# Patient Record
Sex: Female | Born: 1990 | Hispanic: Yes | Marital: Single | Smoking: Never smoker
Health system: Southern US, Community
[De-identification: ages and names within clinical notes are randomized; demographics above are authoritative.]

## PROBLEM LIST (undated history)

## (undated) ENCOUNTER — Inpatient Hospital Stay (HOSPITAL_COMMUNITY): Payer: Self-pay

## (undated) DIAGNOSIS — A749 Chlamydial infection, unspecified: Secondary | ICD-10-CM

## (undated) DIAGNOSIS — Z789 Other specified health status: Secondary | ICD-10-CM

## (undated) HISTORY — DX: Chlamydial infection, unspecified: A74.9

## (undated) HISTORY — PX: NO PAST SURGERIES: SHX2092

## (undated) HISTORY — DX: Other specified health status: Z78.9

## (undated) HISTORY — PX: WISDOM TOOTH EXTRACTION: SHX21

---

## 2015-10-27 NOTE — L&D Delivery Note (Signed)
Delivery Note  After a 10 2nd stage, at 0039  a viable female was delivered via  (Presentation: ROA.  APGAR: 8/9; weight pending.  40 units of pitocin diluted in 1000cc LR was infused rapidly IV.  After 2 minutes, the cord was clamped and cut. 40 units of pitocin diluted in 1000cc LR was infused rapidly IV.  The placenta separated spontaneously and delivered via CCT and maternal pushing effort.  It was inspected and appears to be intact with a 3 VC.  There were the following complications:   Anesthesia: Epidural  Episiotomy: none Lacerations: 2nd degree Suture Repair: 2.0 vicryl Est. Blood Loss (mL): 400  Mom to postpartum.  Baby to Couplet care / Skin to Skin.

## 2015-12-02 ENCOUNTER — Other Ambulatory Visit (HOSPITAL_COMMUNITY): Payer: Self-pay | Admitting: Nurse Practitioner

## 2015-12-02 DIAGNOSIS — Z3491 Encounter for supervision of normal pregnancy, unspecified, first trimester: Secondary | ICD-10-CM

## 2015-12-02 LAB — OB RESULTS CONSOLE RUBELLA ANTIBODY, IGM: Rubella: NON-IMMUNE/NOT IMMUNE

## 2015-12-02 LAB — OB RESULTS CONSOLE ABO/RH: RH Type: POSITIVE

## 2015-12-02 LAB — OB RESULTS CONSOLE GC/CHLAMYDIA
CHLAMYDIA, DNA PROBE: POSITIVE
GC PROBE AMP, GENITAL: NEGATIVE

## 2015-12-02 LAB — OB RESULTS CONSOLE ANTIBODY SCREEN: Antibody Screen: NEGATIVE

## 2015-12-02 LAB — OB RESULTS CONSOLE HEPATITIS B SURFACE ANTIGEN: Hepatitis B Surface Ag: NEGATIVE

## 2015-12-02 LAB — OB RESULTS CONSOLE RPR: RPR: NONREACTIVE

## 2015-12-02 LAB — OB RESULTS CONSOLE HIV ANTIBODY (ROUTINE TESTING): HIV: NONREACTIVE

## 2015-12-04 ENCOUNTER — Ambulatory Visit (HOSPITAL_COMMUNITY): Admission: RE | Admit: 2015-12-04 | Payer: Medicaid Other | Source: Ambulatory Visit

## 2015-12-04 ENCOUNTER — Encounter (HOSPITAL_COMMUNITY): Payer: Self-pay

## 2015-12-04 ENCOUNTER — Other Ambulatory Visit (HOSPITAL_COMMUNITY): Payer: Self-pay | Admitting: Nurse Practitioner

## 2015-12-04 ENCOUNTER — Ambulatory Visit (HOSPITAL_COMMUNITY)
Admission: RE | Admit: 2015-12-04 | Discharge: 2015-12-04 | Disposition: A | Discharge status: Home / Self Care | Payer: Medicaid Other | Source: Ambulatory Visit | Attending: Nurse Practitioner | Admitting: Nurse Practitioner

## 2015-12-04 DIAGNOSIS — Z369 Encounter for antenatal screening, unspecified: Secondary | ICD-10-CM

## 2015-12-04 DIAGNOSIS — Z3A13 13 weeks gestation of pregnancy: Secondary | ICD-10-CM | POA: Insufficient documentation

## 2015-12-04 DIAGNOSIS — Z36 Encounter for antenatal screening of mother: Secondary | ICD-10-CM | POA: Insufficient documentation

## 2015-12-04 DIAGNOSIS — Z3491 Encounter for supervision of normal pregnancy, unspecified, first trimester: Secondary | ICD-10-CM

## 2016-01-27 ENCOUNTER — Other Ambulatory Visit: Payer: Self-pay | Admitting: Obstetrics & Gynecology

## 2016-01-27 DIAGNOSIS — IMO0002 Reserved for concepts with insufficient information to code with codable children: Secondary | ICD-10-CM

## 2016-01-27 DIAGNOSIS — Z3689 Encounter for other specified antenatal screening: Secondary | ICD-10-CM

## 2016-01-30 ENCOUNTER — Ambulatory Visit (HOSPITAL_COMMUNITY)
Admission: RE | Admit: 2016-01-30 | Discharge: 2016-01-30 | Disposition: A | Discharge status: Home / Self Care | Payer: Medicaid Other | Source: Ambulatory Visit | Attending: Nurse Practitioner | Admitting: Nurse Practitioner

## 2016-01-30 ENCOUNTER — Other Ambulatory Visit: Payer: Self-pay | Admitting: Obstetrics & Gynecology

## 2016-01-30 DIAGNOSIS — Z3A22 22 weeks gestation of pregnancy: Secondary | ICD-10-CM | POA: Diagnosis not present

## 2016-01-30 DIAGNOSIS — Z3689 Encounter for other specified antenatal screening: Secondary | ICD-10-CM

## 2016-01-30 DIAGNOSIS — Z36 Encounter for antenatal screening of mother: Secondary | ICD-10-CM | POA: Diagnosis present

## 2016-05-07 LAB — OB RESULTS CONSOLE GC/CHLAMYDIA
CHLAMYDIA, DNA PROBE: NEGATIVE
GC PROBE AMP, GENITAL: NEGATIVE

## 2016-05-07 LAB — OB RESULTS CONSOLE GBS: STREP GROUP B AG: NEGATIVE

## 2016-06-08 ENCOUNTER — Telehealth (HOSPITAL_COMMUNITY): Payer: Self-pay | Admitting: *Deleted

## 2016-06-09 ENCOUNTER — Encounter (HOSPITAL_COMMUNITY): Payer: Self-pay | Admitting: *Deleted

## 2016-06-09 NOTE — Telephone Encounter (Signed)
Preadmission screen  

## 2016-06-10 ENCOUNTER — Encounter (HOSPITAL_COMMUNITY): Payer: Self-pay | Admitting: *Deleted

## 2016-06-10 ENCOUNTER — Inpatient Hospital Stay (HOSPITAL_COMMUNITY)
Admission: AD | Admit: 2016-06-10 | Discharge: 2016-06-10 | Disposition: A | Discharge status: Home / Self Care | Payer: Medicaid Other | Source: Ambulatory Visit | Attending: Family Medicine | Admitting: Family Medicine

## 2016-06-10 NOTE — MAU Note (Signed)
Pt presents complaining of vaginal bleeding. States it was spotting earlier but now it is heavier like a period. Denies leaking of fluid. Some cramping. Reports good fetal movement.

## 2016-06-10 NOTE — Discharge Instructions (Signed)
Keep scheduled induction apt tomorrow.   Braxton Hicks Contractions Contractions of the uterus can occur throughout pregnancy. Contractions are not always a sign that you are in labor.  WHAT ARE BRAXTON HICKS CONTRACTIONS?  Contractions that occur before labor are called Braxton Hicks contractions, or false labor. Toward the end of pregnancy (32-34 weeks), these contractions can develop more often and may become more forceful. This is not true labor because these contractions do not result in opening (dilatation) and thinning of the cervix. They are sometimes difficult to tell apart from true labor because these contractions can be forceful and people have different pain tolerances. You should not feel embarrassed if you go to the hospital with false labor. Sometimes, the only way to tell if you are in true labor is for your health care provider to look for changes in the cervix. If there are no prenatal problems or other health problems associated with the pregnancy, it is completely safe to be sent home with false labor and await the onset of true labor. HOW CAN YOU TELL THE DIFFERENCE BETWEEN TRUE AND FALSE LABOR? False Labor  The contractions of false labor are usually shorter and not as hard as those of true labor.   The contractions are usually irregular.   The contractions are often felt in the front of the lower abdomen and in the groin.   The contractions may go away when you walk around or change positions while lying down.   The contractions get weaker and are shorter lasting as time goes on.   The contractions do not usually become progressively stronger, regular, and closer together as with true labor.  True Labor  Contractions in true labor last 30-70 seconds, become very regular, usually become more intense, and increase in frequency.   The contractions do not go away with walking.   The discomfort is usually felt in the top of the uterus and spreads to the lower  abdomen and low back.   True labor can be determined by your health care provider with an exam. This will show that the cervix is dilating and getting thinner.  WHAT TO REMEMBER  Keep up with your usual exercises and follow other instructions given by your health care provider.   Take medicines as directed by your health care provider.   Keep your regular prenatal appointments.   Eat and drink lightly if you think you are going into labor.   If Braxton Hicks contractions are making you uncomfortable:   Change your position from lying down or resting to walking, or from walking to resting.   Sit and rest in a tub of warm water.   Drink 2-3 glasses of water. Dehydration may cause these contractions.   Do slow and deep breathing several times an hour.  WHEN SHOULD I SEEK IMMEDIATE MEDICAL CARE? Seek immediate medical care if:  Your contractions become stronger, more regular, and closer together.   You have fluid leaking or gushing from your vagina.   You have a fever.   You have vaginal bleeding.   You have continuous abdominal pain.   You have low back pain that you never had before.   You feel your baby's head pushing down and causing pelvic pressure.   Your baby is not moving as much as it used to.    This information is not intended to replace advice given to you by your health care provider. Make sure you discuss any questions you have with your health  care provider.   Document Released: 10/12/2005 Document Revised: 10/17/2013 Document Reviewed: 07/24/2013 Elsevier Interactive Patient Education [employer]2016 [employer] Inc.

## 2016-06-11 ENCOUNTER — Inpatient Hospital Stay (HOSPITAL_COMMUNITY): Payer: Medicaid Other | Admitting: Anesthesiology

## 2016-06-11 ENCOUNTER — Other Ambulatory Visit: Payer: Self-pay | Admitting: Advanced Practice Midwife

## 2016-06-11 ENCOUNTER — Inpatient Hospital Stay (HOSPITAL_COMMUNITY)
Admission: RE | Admit: 2016-06-11 | Discharge: 2016-06-14 | DRG: 775 | Disposition: A | Discharge status: Home / Self Care | Payer: Medicaid Other | Source: Ambulatory Visit | Attending: Obstetrics & Gynecology | Admitting: Obstetrics & Gynecology

## 2016-06-11 ENCOUNTER — Encounter (HOSPITAL_COMMUNITY): Payer: Self-pay

## 2016-06-11 DIAGNOSIS — Z6841 Body Mass Index (BMI) 40.0 and over, adult: Secondary | ICD-10-CM

## 2016-06-11 DIAGNOSIS — O99214 Obesity complicating childbirth: Secondary | ICD-10-CM | POA: Diagnosis present

## 2016-06-11 DIAGNOSIS — Z3A41 41 weeks gestation of pregnancy: Secondary | ICD-10-CM | POA: Diagnosis not present

## 2016-06-11 DIAGNOSIS — O480 Post-term pregnancy: Secondary | ICD-10-CM | POA: Diagnosis present

## 2016-06-11 LAB — CBC
HCT: 39.2 % (ref 36.0–46.0)
Hemoglobin: 14.2 g/dL (ref 12.0–15.0)
MCH: 32.6 pg (ref 26.0–34.0)
MCHC: 36.2 g/dL — ABNORMAL HIGH (ref 30.0–36.0)
MCV: 90.1 fL (ref 78.0–100.0)
PLATELETS: 209 10*3/uL (ref 150–400)
RBC: 4.35 MIL/uL (ref 3.87–5.11)
RDW: 12.8 % (ref 11.5–15.5)
WBC: 13.2 10*3/uL — AB (ref 4.0–10.5)

## 2016-06-11 LAB — TYPE AND SCREEN
ABO/RH(D): O POS
ANTIBODY SCREEN: NEGATIVE

## 2016-06-11 LAB — RPR: RPR: NONREACTIVE

## 2016-06-11 LAB — ABO/RH: ABO/RH(D): O POS

## 2016-06-11 MED ORDER — SOD CITRATE-CITRIC ACID 500-334 MG/5ML PO SOLN
30.0000 mL | ORAL | Status: DC | PRN
Start: 2016-06-11 — End: 2016-06-12

## 2016-06-11 MED ORDER — OXYTOCIN BOLUS FROM INFUSION
500.0000 mL | Freq: Once | INTRAVENOUS | Status: AC
  Administered 2016-06-12: 500 mL via INTRAVENOUS

## 2016-06-11 MED ORDER — OXYTOCIN 40 UNITS IN LACTATED RINGERS INFUSION - SIMPLE MED
1.0000 m[IU]/min | INTRAVENOUS | Status: DC
  Administered 2016-06-11: 2 m[IU]/min via INTRAVENOUS
  Filled 2016-06-11: qty 1000

## 2016-06-11 MED ORDER — ACETAMINOPHEN 325 MG PO TABS: 650.0000 mg | ORAL_TABLET | ORAL | Status: DC | PRN

## 2016-06-11 MED ORDER — OXYCODONE-ACETAMINOPHEN 5-325 MG PO TABS: 1.0000 | ORAL_TABLET | ORAL | Status: DC | PRN

## 2016-06-11 MED ORDER — PHENYLEPHRINE 40 MCG/ML (10ML) SYRINGE FOR IV PUSH (FOR BLOOD PRESSURE SUPPORT): 80.0000 ug | PREFILLED_SYRINGE | INTRAVENOUS | Status: DC | PRN

## 2016-06-11 MED ORDER — EPHEDRINE 5 MG/ML INJ
10.0000 mg | INTRAVENOUS | Status: DC | PRN
  Filled 2016-06-11: qty 4

## 2016-06-11 MED ORDER — FENTANYL 2.5 MCG/ML BUPIVACAINE 1/10 % EPIDURAL INFUSION (WH - ANES)
14.0000 mL/h | INTRAMUSCULAR | Status: DC | PRN
  Administered 2016-06-11 (×3): 14 mL/h via EPIDURAL
  Filled 2016-06-11 (×2): qty 125

## 2016-06-11 MED ORDER — ONDANSETRON HCL 4 MG/2ML IJ SOLN: 4.0000 mg | Freq: Four times a day (QID) | INTRAMUSCULAR | Status: DC | PRN

## 2016-06-11 MED ORDER — LACTATED RINGERS IV SOLN: 500.0000 mL | Freq: Once | INTRAVENOUS | Status: DC

## 2016-06-11 MED ORDER — LACTATED RINGERS IV SOLN
INTRAVENOUS | Status: DC
  Administered 2016-06-11 (×2): via INTRAVENOUS

## 2016-06-11 MED ORDER — EPHEDRINE 5 MG/ML INJ: 10.0000 mg | INTRAVENOUS | Status: DC | PRN

## 2016-06-11 MED ORDER — DIPHENHYDRAMINE HCL 50 MG/ML IJ SOLN: 12.5000 mg | INTRAMUSCULAR | Status: DC | PRN

## 2016-06-11 MED ORDER — OXYTOCIN 40 UNITS IN LACTATED RINGERS INFUSION - SIMPLE MED: 2.5000 [IU]/h | INTRAVENOUS | Status: DC

## 2016-06-11 MED ORDER — LACTATED RINGERS IV SOLN: 500.0000 mL | INTRAVENOUS | Status: DC | PRN

## 2016-06-11 MED ORDER — TERBUTALINE SULFATE 1 MG/ML IJ SOLN
0.2500 mg | Freq: Once | INTRAMUSCULAR | Status: DC | PRN
  Filled 2016-06-11: qty 1

## 2016-06-11 MED ORDER — FENTANYL CITRATE (PF) 100 MCG/2ML IJ SOLN
100.0000 ug | INTRAMUSCULAR | Status: DC | PRN
  Administered 2016-06-11 (×2): 100 ug via INTRAVENOUS
  Filled 2016-06-11 (×2): qty 2

## 2016-06-11 MED ORDER — PHENYLEPHRINE 40 MCG/ML (10ML) SYRINGE FOR IV PUSH (FOR BLOOD PRESSURE SUPPORT)
80.0000 ug | PREFILLED_SYRINGE | INTRAVENOUS | Status: DC | PRN
  Filled 2016-06-11: qty 10
  Filled 2016-06-11: qty 5

## 2016-06-11 MED ORDER — LIDOCAINE HCL (PF) 1 % IJ SOLN
30.0000 mL | INTRAMUSCULAR | Status: DC | PRN
  Filled 2016-06-11: qty 30

## 2016-06-11 MED ORDER — OXYCODONE-ACETAMINOPHEN 5-325 MG PO TABS: 2.0000 | ORAL_TABLET | ORAL | Status: DC | PRN

## 2016-06-11 MED ORDER — LACTATED RINGERS IV SOLN
500.0000 mL | Freq: Once | INTRAVENOUS | Status: AC
  Administered 2016-06-11: 1000 mL via INTRAVENOUS

## 2016-06-11 MED ORDER — PHENYLEPHRINE 40 MCG/ML (10ML) SYRINGE FOR IV PUSH (FOR BLOOD PRESSURE SUPPORT)
80.0000 ug | PREFILLED_SYRINGE | INTRAVENOUS | Status: DC | PRN
  Filled 2016-06-11: qty 5

## 2016-06-11 MED ORDER — MISOPROSTOL 25 MCG QUARTER TABLET
25.0000 ug | ORAL_TABLET | ORAL | Status: DC | PRN
  Filled 2016-06-11: qty 1
  Filled 2016-06-11: qty 0.25

## 2016-06-11 MED ORDER — LIDOCAINE HCL (PF) 1 % IJ SOLN
INTRAMUSCULAR | Status: DC | PRN
  Administered 2016-06-11: 2 mL
  Administered 2016-06-11: 3 mL
  Administered 2016-06-11: 5 mL

## 2016-06-11 NOTE — Progress Notes (Signed)
Pt noted to be C/C/0 station.  No pressure. FHR cat 1.  Will labor down

## 2016-06-11 NOTE — Progress Notes (Signed)
LABOR PROGRESS NOTE  Natasha ChurchMarlen Guevara Conway is a 25 y.o. G1P0000 with IUP at 1130w0d by LMP presenting for IOL for postdates.  Found to be in active labor on admission  Subjective: Doing well, no complaints  Objective: BP 136/67   Pulse 97   Temp 98.9 F (37.2 C) (Oral)   Resp 20   Ht 5\' 2"  (1.575 m)   Wt 103 kg (227 lb)   LMP 08/29/2015   BMI 41.52 kg/m  or  Vitals:   06/11/16 0732 06/11/16 0734 06/11/16 1049 06/11/16 1354  BP:  120/74 124/74 136/67  Pulse:  96 91 97  Resp: 18 18 20 20   Temp:  99 F (37.2 C) 97.7 F (36.5 C) 98.9 F (37.2 C)  TempSrc: Oral Oral Oral Oral  Weight: 103 kg (227 lb)     Height: 5\' 2"  (1.575 m)       FHT:  baseline 150, mod variability, + accelerations, no decelerations Uterine activity: toco having difficulty picking up activity but approx q505min Dilation: 4.5 Effacement (%): 100 Cervical Position: Anterior Station: -2 Presentation: Vertex Exam by:: Enis SlipperJane Bailey, RN  7.8/100/-1 by myself and RN Enis SlipperJane Bailey   Labs: Lab Results  Component Value Date   WBC 13.2 (H) 06/11/2016   HGB 14.2 06/11/2016   HCT 39.2 06/11/2016   MCV 90.1 06/11/2016   PLT 209 06/11/2016    Patient Active Problem List   Diagnosis Date Noted  . Post term pregnancy, 41 weeks 06/11/2016    Assessment / Plan: 25 y.o. G1P0000 at 1430w0d here for IOL for postdates, found to be in active labor on admission  Labor: consider AROM vs start pitocin Fetal Wellbeing:  Category I Pain Control:  fentanyl Anticipated MOD:  SVD  Leland HerElsia J Yoo, DO  PGY-1 8/17/20172:06 PM

## 2016-06-11 NOTE — Progress Notes (Signed)
LABOR PROGRESS NOTE  [name] is a 25 y.o. G1P0000 with IUP at 6527w0d by LMPpresenting for IOL for postdates.  Found to be in active labor on admission  Subjective: Feels better on fentanyl but still uncomfortable during AROM and placement of IUPC  Objective: BP 107/82   Pulse 92   Temp 98.9 F (37.2 C) (Oral)   Resp 20   Ht 5\' 2"  (1.575 m)   Wt 103 kg (227 lb)   LMP 08/29/2015   BMI 41.52 kg/m  or  Vitals:   06/11/16 0734 06/11/16 1049 06/11/16 1354 06/11/16 1400  BP: 120/74 124/74 136/67 107/82  Pulse: 96 91 97 92  Resp: 18 20 20 20   Temp: 99 F (37.2 C) 97.7 F (36.5 C) 98.9 F (37.2 C)   TempSrc: Oral Oral Oral   Weight:      Height:        FHT: baseline 150, mod variability, + accelerations, no decelerations Uterine activity toco having difficulty picking up activity but approx q85min Dilation: 7.5 Effacement (%): 100 Cervical Position: Anterior Station: -2, -1 Presentation: Vertex Exam by:: Dr. Artist PaisYoo & Enis SlipperJane Bailey, RN  Labs: Lab Results  Component Value Date   WBC 13.2 (H) 06/11/2016   HGB 14.2 06/11/2016   HCT 39.2 06/11/2016   MCV 90.1 06/11/2016   PLT 209 06/11/2016    Patient Active Problem List   Diagnosis Date Noted  . Post term pregnancy, 41 weeks 06/11/2016    Assessment / Plan: 25 y.o. G1P0000 at 4527w0d here for IOL for postdates. Found to be in active labor on admission. Now AROM to clear and IUPC in place  Labor: continue pitocin Fetal Wellbeing:  Category I Pain Control:  Fentanyl, patient considering epidural Anticipated MOD:  SVD  Leland HerElsia J Yoo, DO  PGY-1, [facility] 8/17/20172:46 PM

## 2016-06-11 NOTE — Anesthesia Pain Management Evaluation Note (Signed)
  CRNA Pain Management Visit Note  Patient: Natasha Conway, 25 y.o., female  "Hello I am a member of the anesthesia team at [facility]. We have an anesthesia team available at all times to provide care throughout the hospital, including epidural management and anesthesia for C-section. I don't know your plan for the delivery whether it a natural birth, water birth, IV sedation, nitrous supplementation, doula or epidural, but we want to meet your pain goals."   1.Was your pain managed to your expectations on prior hospitalizations?   No prior hospitalizations  2.What is your expectation for pain management during this hospitalization?     Labor support without medications  3.How can we help you reach that goal? Patient wants to try natural childbirth but may consider other options depending on labor progression.  Patient was informed of the options of IV pain medication, nitrous, and epidural, but was told that this is her plan and was not coerced into receiving any interventions.  All questions answered.  Record the patient's initial score and the patient's pain goal.   Pain: 4  Pain Goal: 6 [facility] wants you to be able to say your pain was always managed very well.  Natasha Conway 06/11/2016

## 2016-06-11 NOTE — Anesthesia Procedure Notes (Signed)
Epidural Patient location during procedure: OB  Staffing Anesthesiologist: Marcene DuosFITZGERALD, Mahalia Dykes Performed: anesthesiologist   Preanesthetic Checklist Completed: patient identified, site marked, surgical consent, pre-op evaluation, timeout performed, IV checked, risks and benefits discussed and monitors and equipment checked  Epidural Patient position: sitting Prep: site prepped and draped and DuraPrep Patient monitoring: continuous pulse ox and blood pressure Approach: midline Location: L4-L5 Injection technique: LOR saline  Needle:  Needle type: Tuohy  Needle gauge: 17 G Needle length: 9 cm and 9 Needle insertion depth: 8 cm Catheter type: closed end flexible Catheter size: 19 Gauge Catheter at skin depth: 14 (13cm initially at the skin. Advanced to 14 when laid in lat decub position) cm Test dose: negative  Assessment Events: blood not aspirated, injection not painful, no injection resistance, negative IV test and no paresthesia

## 2016-06-11 NOTE — Anesthesia Preprocedure Evaluation (Signed)
Anesthesia Evaluation  Patient identified by MRN, date of birth, ID band Patient awake    Reviewed: Allergy & Precautions, Patient's Chart, lab work & pertinent test results  Airway Mallampati: III       Dental   Pulmonary neg pulmonary ROS,    Pulmonary exam normal        Cardiovascular negative cardio ROS Normal cardiovascular exam     Neuro/Psych negative neurological ROS     GI/Hepatic negative GI ROS, Neg liver ROS,   Endo/Other  Morbid obesity  Renal/GU negative Renal ROS     Musculoskeletal   Abdominal   Peds  Hematology negative hematology ROS (+)   Anesthesia Other Findings   Reproductive/Obstetrics (+) Pregnancy                             Lab Results  Component Value Date   WBC 13.2 (H) 06/11/2016   HGB 14.2 06/11/2016   HCT 39.2 06/11/2016   MCV 90.1 06/11/2016   PLT 209 06/11/2016    Anesthesia Physical Anesthesia Plan  ASA: III  Anesthesia Plan: Epidural   Post-op Pain Management:    Induction:   Airway Management Planned: Natural Airway  Additional Equipment:   Intra-op Plan:   Post-operative Plan:   Informed Consent: I have reviewed the patients History and Physical, chart, labs and discussed the procedure including the risks, benefits and alternatives for the proposed anesthesia with the patient or authorized representative who has indicated his/her understanding and acceptance.     Plan Discussed with:   Anesthesia Plan Comments:         Anesthesia Quick Evaluation

## 2016-06-11 NOTE — H&P (Signed)
LABOR AND DELIVERY ADMISSION HISTORY AND PHYSICAL NOTE  Natasha Conway is a 25 y.o. female G1P0000 with IUP at 3919w0d by LMP presenting for IOL for postdates.  Found to be in active labor on admission She reports positive fetal movement. She denies leakage of fluid or vaginal bleeding.  Prenatal History/Complications:  Past Medical History: Past Medical History:  Diagnosis Date  . Medical history non-contributory     Past Surgical History: Past Surgical History:  Procedure Laterality Date  . NO PAST SURGERIES    . WISDOM TOOTH EXTRACTION      Obstetrical History: OB History    Gravida Para Term Preterm AB Living   1 0 0 0 0 0   SAB TAB Ectopic Multiple Live Births   0 0 0 0 0      Social History: Social History   Social History  . Marital status: Single    Spouse name: N/A  . Number of children: N/A  . Years of education: N/A   Social History Main Topics  . Smoking status: Never Smoker  . Smokeless tobacco: Never Used  . Alcohol use No  . Drug use: No  . Sexual activity: Not Currently    Birth control/ protection: None   Other Topics Concern  . None   Social History Narrative  . None    Family History: History reviewed. No pertinent family history.  Allergies: No Known Allergies  Prescriptions Prior to Admission  Medication Sig Dispense Refill Last Dose  . Prenatal Vit-Fe Fumarate-FA (PRENATAL MULTIVITAMIN) TABS tablet Take 1 tablet by mouth at bedtime.   06/10/2016 at Unknown time     Review of Systems   All systems reviewed and negative except as stated in HPI  Blood pressure 120/74, pulse 96, temperature 99 F (37.2 C), temperature source Oral, resp. rate 18, height 5\' 2"  (1.575 m), weight 103 kg (227 lb), last menstrual period 08/29/2015. General appearance: alert, cooperative and no distress Lungs: no respiratory distress Heart: regular rate Abdomen: soft, non-tender Extremities: No calf swelling or tenderness Presentation:  cephalic by cervical exam Fetal monitoring: baseline 150, mod variability, + accelerations, no decelerations Uterine activity: toco unable to pick up ctx Dilation: 4.5 Effacement (%): 100 Station: -2 Exam by:: Enis SlipperJane Bailey, RN   Prenatal labs: ABO, Rh: --/--/O POS (08/17 0730) Antibody: NEG (08/17 0730) Rubella: nonimmune RPR: Nonreactive (02/06 0000)  HBsAg: Negative (02/06 0000)  HIV: Non-reactive (02/06 0000)  GBS: Negative (07/13 0000)  Genetic screening:  declined Anatomy US: normal  Prenatal Transfer Tool  Maternal Diabetes: No Genetic Screening: Declined Maternal Ultrasounds/Referrals: Normal Fetal Ultrasounds or other Referrals:  None Maternal Substance Abuse:  No Significant Maternal Medications:  None Significant Maternal Lab Results: Lab values include: Group B Strep negative  Results for orders placed or performed during the hospital encounter of 06/11/16 (from the past 24 hour(s))  CBC   Collection Time: 06/11/16  7:30 AM  Result Value Ref Range   WBC 13.2 (H) 4.0 - 10.5 K/uL   RBC 4.35 3.87 - 5.11 MIL/uL   Hemoglobin 14.2 12.0 - 15.0 g/dL   HCT 16.139.2 09.636.0 - 04.546.0 %   MCV 90.1 78.0 - 100.0 fL   MCH 32.6 26.0 - 34.0 pg   MCHC 36.2 (H) 30.0 - 36.0 g/dL   RDW 40.912.8 81.111.5 - 91.415.5 %   Platelets 209 150 - 400 K/uL  Type and screen [facility]   Collection Time: 06/11/16  7:30 AM  Result Value Ref  Range   ABO/RH(D) O POS    Antibody Screen NEG    Sample Expiration 06/14/2016     Patient Active Problem List   Diagnosis Date Noted  . Post term pregnancy, 41 weeks 06/11/2016    Assessment: Natasha Conway is a 25 y.o. G1P0000 with IUP at 1321w0d by LMP presenting for IOL for postdates. Found to be in active labor on admission  #Labor: consider AROM, expectant management #Pain: Undecided on epidural #FWB: Category I #ID:  GBS neg  #MOF: breast first and bottle #MOC: undecided #Circ:  declined  Leland HerElsia J Yoo, DO PGY-1 8/17/201710:25  AM   OB FELLOW HISTORY AND PHYSICAL ATTESTATION  I have seen and examined this patient; I agree with above documentation in the resident's note.    Jen MowElizabeth Mumaw, DO OB Fellow 06/11/2016, 10:36 AM

## 2016-06-12 ENCOUNTER — Encounter (HOSPITAL_COMMUNITY): Payer: Self-pay

## 2016-06-12 DIAGNOSIS — O480 Post-term pregnancy: Secondary | ICD-10-CM

## 2016-06-12 DIAGNOSIS — O99214 Obesity complicating childbirth: Secondary | ICD-10-CM

## 2016-06-12 DIAGNOSIS — Z3A41 41 weeks gestation of pregnancy: Secondary | ICD-10-CM

## 2016-06-12 MED ORDER — ZOLPIDEM TARTRATE 5 MG PO TABS
5.0000 mg | ORAL_TABLET | Freq: Every evening | ORAL | Status: DC | PRN
Start: 2016-06-12 — End: 2016-06-14

## 2016-06-12 MED ORDER — PRENATAL MULTIVITAMIN CH
1.0000 | ORAL_TABLET | Freq: Every day | ORAL | Status: DC
  Administered 2016-06-12 – 2016-06-14 (×3): 1 via ORAL
  Filled 2016-06-12 (×3): qty 1

## 2016-06-12 MED ORDER — WITCH HAZEL-GLYCERIN EX PADS: 1.0000 "application " | MEDICATED_PAD | CUTANEOUS | Status: DC | PRN

## 2016-06-12 MED ORDER — METHYLERGONOVINE MALEATE 0.2 MG PO TABS: 0.2000 mg | ORAL_TABLET | ORAL | Status: DC | PRN

## 2016-06-12 MED ORDER — IBUPROFEN 600 MG PO TABS
600.0000 mg | ORAL_TABLET | Freq: Four times a day (QID) | ORAL | Status: DC
Start: 2016-06-12 — End: 2016-06-14
  Administered 2016-06-12 – 2016-06-14 (×9): 600 mg via ORAL
  Filled 2016-06-12 (×9): qty 1

## 2016-06-12 MED ORDER — ONDANSETRON HCL 4 MG/2ML IJ SOLN: 4.0000 mg | INTRAMUSCULAR | Status: DC | PRN

## 2016-06-12 MED ORDER — OXYCODONE HCL 5 MG PO TABS: 5.0000 mg | ORAL_TABLET | ORAL | Status: DC | PRN

## 2016-06-12 MED ORDER — DIBUCAINE 1 % RE OINT: 1.0000 "application " | TOPICAL_OINTMENT | RECTAL | Status: DC | PRN

## 2016-06-12 MED ORDER — BISACODYL 10 MG RE SUPP
10.0000 mg | Freq: Every day | RECTAL | Status: DC | PRN
  Administered 2016-06-13: 10 mg via RECTAL

## 2016-06-12 MED ORDER — TETANUS-DIPHTH-ACELL PERTUSSIS 5-2.5-18.5 LF-MCG/0.5 IM SUSP: 0.5000 mL | Freq: Once | INTRAMUSCULAR | Status: DC

## 2016-06-12 MED ORDER — DIPHENHYDRAMINE HCL 25 MG PO CAPS: 25.0000 mg | ORAL_CAPSULE | Freq: Four times a day (QID) | ORAL | Status: DC | PRN

## 2016-06-12 MED ORDER — ONDANSETRON HCL 4 MG PO TABS: 4.0000 mg | ORAL_TABLET | ORAL | Status: DC | PRN

## 2016-06-12 MED ORDER — FERROUS SULFATE 325 (65 FE) MG PO TABS
325.0000 mg | ORAL_TABLET | Freq: Two times a day (BID) | ORAL | Status: DC
  Administered 2016-06-12 – 2016-06-14 (×5): 325 mg via ORAL
  Filled 2016-06-12 (×4): qty 1

## 2016-06-12 MED ORDER — METHYLERGONOVINE MALEATE 0.2 MG/ML IJ SOLN: 0.2000 mg | INTRAMUSCULAR | Status: DC | PRN

## 2016-06-12 MED ORDER — ACETAMINOPHEN 325 MG PO TABS
650.0000 mg | ORAL_TABLET | ORAL | Status: DC | PRN
  Administered 2016-06-14: 650 mg via ORAL
  Filled 2016-06-12 (×2): qty 2

## 2016-06-12 MED ORDER — SIMETHICONE 80 MG PO CHEW: 80.0000 mg | CHEWABLE_TABLET | ORAL | Status: DC | PRN

## 2016-06-12 MED ORDER — OXYCODONE HCL 5 MG PO TABS: 10.0000 mg | ORAL_TABLET | ORAL | Status: DC | PRN

## 2016-06-12 MED ORDER — MEASLES, MUMPS & RUBELLA VAC ~~LOC~~ INJ
0.5000 mL | INJECTION | Freq: Once | SUBCUTANEOUS | Status: AC
  Administered 2016-06-13: 0.5 mL via SUBCUTANEOUS
  Filled 2016-06-12 (×2): qty 0.5

## 2016-06-12 MED ORDER — BENZOCAINE-MENTHOL 20-0.5 % EX AERO
1.0000 "application " | INHALATION_SPRAY | CUTANEOUS | Status: DC | PRN
  Administered 2016-06-12: 1 via TOPICAL
  Filled 2016-06-12: qty 56

## 2016-06-12 MED ORDER — FLEET ENEMA 7-19 GM/118ML RE ENEM: 1.0000 | ENEMA | Freq: Every day | RECTAL | Status: DC | PRN

## 2016-06-12 MED ORDER — COCONUT OIL OIL: 1.0000 "application " | TOPICAL_OIL | Status: DC | PRN

## 2016-06-12 MED ORDER — DOCUSATE SODIUM 100 MG PO CAPS
100.0000 mg | ORAL_CAPSULE | Freq: Two times a day (BID) | ORAL | Status: DC
  Administered 2016-06-12 – 2016-06-14 (×3): 100 mg via ORAL
  Filled 2016-06-12 (×4): qty 1

## 2016-06-12 NOTE — Anesthesia Postprocedure Evaluation (Signed)
Anesthesia Post Note  Patient: Natasha Conway  Procedure(s) Performed: * No procedures listed *  Patient location during evaluation: Mother Baby Anesthesia Type: Epidural Level of consciousness: awake and alert Pain management: pain level controlled Vital Signs Assessment: post-procedure vital signs reviewed and stable Respiratory status: spontaneous breathing, nonlabored ventilation and respiratory function stable Cardiovascular status: stable Postop Assessment: no headache, no backache, epidural receding and patient able to bend at knees Anesthetic complications: no     Last Vitals:  Vitals:   06/12/16 0300 06/12/16 0400  BP: 133/67 130/63  Pulse: (!) 112 (!) 109  Resp: 18 18  Temp: 37 C 37.6 C    Last Pain:  Vitals:   06/12/16 0613  TempSrc:   PainSc: 3    Pain Goal: Patients Stated Pain Goal: 7 (06/11/16 0806)               Rica RecordsICKELTON,Keeley Sussman

## 2016-06-12 NOTE — Lactation Note (Signed)
This note was copied from a baby's chart. Lactation Consultation Note  Patient Name: Natasha Conway ChurchMarlen Guevara Martinez ZOXWR'UToday's Date: 06/12/2016 Reason for consult: Initial assessment;Difficult latch  Visited with Mom and GMOB, baby 713 hrs old.  Baby born 4541w1d, G1P1, weighing 8 lbs 8oz.  Baby received 23 ml while in obs as Mom insisted that baby did not want the breast.  Offered assistance latching.  Mom was hesitant as her right nipple was sore from earlier attempts.  Baby in crib starting to awaken.  Placed baby in football hold.  Manual expression for drops of colostrum.  Teaching done on importance of placing her hands back off areola when supporting breast.  Baby was able to latch deeply, and became rhythmic.  Taught Mom how to identify swallows.  After a 10 minute feeding where there was no pinching felt, tried to relatch baby but he wouldn't.  After feeding, noted that baby has an anterior lingual frenulum, will continue to monitor for difficulty with latching.  Discussed option of pump and bottle feeding with Mom, as she had stated she was returning to work in 6 weeks.  Has WIC, and [facility].   Encouraged continued skin to skin, and feeding baby on cue.  Recommended exclusive breastfeeding.  To ask for help as needed with latching.   Brochure left with Mom, explained about IP and OP Lactation services available to her.  Consult Status Consult Status: Follow-up Date: 06/13/16 Follow-up type: In-patient    Judee ClaraSmith, Tavaris Eudy E 06/12/2016, 2:59 PM

## 2016-06-12 NOTE — Progress Notes (Signed)
UR chart review completed.  

## 2016-06-13 NOTE — Progress Notes (Signed)
Post Partum Day 1 Subjective: no complaints, up ad lib, voiding, tolerating PO and + flatus No BM yet, is trying stool softener. Still undecided on birth control options, no questions this am.  Objective: Blood pressure 114/67, pulse 73, temperature 98.2 F (36.8 C), temperature source Oral, resp. rate 16, height 5\' 2"  (1.575 m), weight 103 kg (227 lb), last menstrual period 08/29/2015, SpO2 99 %, unknown if currently breastfeeding.  Physical Exam:  General: alert, cooperative and no distress Lochia: appropriate Uterine Fundus: firm DVT Evaluation: No evidence of DVT seen on physical exam. Negative Homan's sign. No cords or calf tenderness.   Recent Labs  06/11/16 0730  HGB 14.2  HCT 39.2    Assessment/Plan: Plan for discharge tomorrow  Counseled on birth control options.   LOS: 2 days   Leland Herlsia J Yoo 06/13/2016, 7:26 AM   OB FELLOW POSTPARTUM PROGRESS NOTE ATTESTATION  I have seen and examined this patient and agree with above documentation in the resident's note.   Ernestina PennaNicholas Schenk, MD 9:43 AM

## 2016-06-14 MED ORDER — IBUPROFEN 600 MG PO TABS: 600.0000 mg | ORAL_TABLET | Freq: Four times a day (QID) | ORAL | 0 refills | Status: DC | PRN

## 2016-06-14 NOTE — Progress Notes (Signed)
Pt discharged home with instruction. Verbalizes and understanding. No concerns noted at this time. Marvina Danner L Staton Markey

## 2016-06-14 NOTE — Discharge Instructions (Signed)
NO SEX UNTIL AFTER YOU GET YOUR BIRTH CONTROL  ° °Postpartum Care After Vaginal Delivery °After you deliver your newborn (postpartum period), the usual stay in the hospital is 24-72 hours. If there were problems with your labor or delivery, or if you have other medical problems, you might be in the hospital longer.  °While you are in the hospital, you will receive help and instructions on how to care for yourself and your newborn during the postpartum period.  °While you are in the hospital: °· Be sure to tell your nurses if you have pain or discomfort, as well as where you feel the pain and what makes the pain worse. °· If you had an incision made near your vagina (episiotomy) or if you had some tearing during delivery, the nurses may put ice packs on your episiotomy or tear. The ice packs may help to reduce the pain and swelling. °· If you are breastfeeding, you may feel uncomfortable contractions of your uterus for a couple of weeks. This is normal. The contractions help your uterus get back to normal size. °· It is normal to have some bleeding after delivery. °· For the first 1-3 days after delivery, the flow is red and the amount may be similar to a period. °· It is common for the flow to start and stop. °· In the first few days, you may pass some small clots. Let your nurses know if you begin to pass large clots or your flow increases. °· Do not  flush blood clots down the toilet before having the nurse look at them. °· During the next 3-10 days after delivery, your flow should become more watery and pink or brown-tinged in color. °· Ten to fourteen days after delivery, your flow should be a small amount of yellowish-white discharge. °· The amount of your flow will decrease over the first few weeks after delivery. Your flow may stop in 6-8 weeks. Most women have had their flow stop by 12 weeks after delivery. °· You should change your sanitary pads frequently. °· Wash your hands thoroughly with soap and water  for at least 20 seconds after changing pads, using the toilet, or before holding or feeding your newborn. °· You should feel like you need to empty your bladder within the first 6-8 hours after delivery. °· In case you become weak, lightheaded, or faint, call your nurse before you get out of bed for the first time and before you take a shower for the first time. °· Within the first few days after delivery, your breasts may begin to feel tender and full. This is called engorgement. Breast tenderness usually goes away within 48-72 hours after engorgement occurs. You may also notice milk leaking from your breasts. If you are not breastfeeding, do not stimulate your breasts. Breast stimulation can make your breasts produce more milk. °· Spending as much time as possible with your newborn is very important. During this time, you and your newborn can feel close and get to know each other. Having your newborn stay in your room (rooming in) will help to strengthen the bond with your newborn.  It will give you time to get to know your newborn and become comfortable caring for your newborn. °· Your hormones change after delivery. Sometimes the hormone changes can temporarily cause you to feel sad or tearful. These feelings should not last more than a few days. If these feelings last longer than that, you should talk to your caregiver. °· If desired,   talk to your caregiver about methods of family planning or contraception. °· Talk to your caregiver about immunizations. Your caregiver may want you to have the following immunizations before leaving the hospital: °· Tetanus, diphtheria, and pertussis (Tdap) or tetanus and diphtheria (Td) immunization. It is very important that you and your family (including grandparents) or others caring for your newborn are up-to-date with the Tdap or Td immunizations. The Tdap or Td immunization can help protect your newborn from getting ill. °· Rubella immunization. °· Varicella (chickenpox)  immunization. °· Influenza immunization. You should receive this annual immunization if you did not receive the immunization during your pregnancy. °  °This information is not intended to replace advice given to you by your health care provider. Make sure you discuss any questions you have with your health care provider. °  °Document Released: 08/09/2007 Document Revised: 07/06/2012 Document Reviewed: 06/08/2012 °Elsevier Interactive Patient Education ©2016 [employer] ° °Breastfeeding °Deciding to breastfeed is one of the best choices you can make for you and your baby. A change in hormones during pregnancy causes your breast tissue to grow and increases the number and size of your milk ducts. These hormones also allow proteins, sugars, and fats from your blood supply to make breast milk in your milk-producing glands. Hormones prevent breast milk from being released before your baby is born as well as prompt milk flow after birth. Once breastfeeding has begun, thoughts of your baby, as well as his or her sucking or crying, can stimulate the release of milk from your milk-producing glands.  °BENEFITS OF BREASTFEEDING °For Your Baby °· Your first milk (colostrum) helps your baby's digestive system function better. °· There are antibodies in your milk that help your baby fight off infections. °· Your baby has a lower incidence of asthma, allergies, and sudden infant death syndrome. °· The nutrients in breast milk are better for your baby than infant formulas and are designed uniquely for your baby's needs. °· Breast milk improves your baby's brain development. °· Your baby is less likely to develop other conditions, such as childhood obesity, asthma, or type 2 diabetes mellitus. °For You °· Breastfeeding helps to create a very special bond between you and your baby. °· Breastfeeding is convenient. Breast milk is always available at the correct temperature and costs nothing. °· Breastfeeding helps to burn calories  and helps you lose the weight gained during pregnancy. °· Breastfeeding makes your uterus contract to its prepregnancy size faster and slows bleeding (lochia) after you give birth.   °· Breastfeeding helps to lower your risk of developing type 2 diabetes mellitus, osteoporosis, and breast or ovarian cancer later in life. °SIGNS THAT YOUR BABY IS HUNGRY °Early Signs of Hunger °· Increased alertness or activity. °· Stretching. °· Movement of the head from side to side. °· Movement of the head and opening of the mouth when the corner of the mouth or cheek is stroked (rooting). °· Increased sucking sounds, smacking lips, cooing, sighing, or squeaking. °· Hand-to-mouth movements. °· Increased sucking of fingers or hands. °Late Signs of Hunger °· Fussing. °· Intermittent crying. °Extreme Signs of Hunger °Signs of extreme hunger will require calming and consoling before your baby will be able to breastfeed successfully. Do not wait for the following signs of extreme hunger to occur before you initiate breastfeeding: °· Restlessness. °· A loud, strong cry. °· Screaming. °BREASTFEEDING BASICS °Breastfeeding Initiation °· Find a comfortable place to sit or lie down, with your neck and back well supported. °· Place   a pillow or rolled up blanket under your baby to bring him or her to the level of your breast (if you are seated). Nursing pillows are specially designed to help support your arms and your baby while you breastfeed. °· Make sure that your baby's abdomen is facing your abdomen. °· Gently massage your breast. With your fingertips, massage from your chest wall toward your nipple in a circular motion. This encourages milk flow. You may need to continue this action during the feeding if your milk flows slowly. °· Support your breast with 4 fingers underneath and your thumb above your nipple. Make sure your fingers are well away from your nipple and your baby's mouth. °· Stroke your baby's lips gently with your finger or  nipple. °· When your baby's mouth is open wide enough, quickly bring your baby to your breast, placing your entire nipple and as much of the colored area around your nipple (areola) as possible into your baby's mouth. °¨ More areola should be visible above your baby's upper lip than below the lower lip. °¨ Your baby's tongue should be between his or her lower gum and your breast. °· Ensure that your baby's mouth is correctly positioned around your nipple (latched). Your baby's lips should create a seal on your breast and be turned out (everted). °· It is common for your baby to suck about 2-3 minutes in order to start the flow of breast milk. °Latching °Teaching your baby how to latch on to your breast properly is very important. An improper latch can cause nipple pain and decreased milk supply for you and poor weight gain in your baby. Also, if your baby is not latched onto your nipple properly, he or she may swallow some air during feeding. This can make your baby fussy. Burping your baby when you switch breasts during the feeding can help to get rid of the air. However, teaching your baby to latch on properly is still the best way to prevent fussiness from swallowing air while breastfeeding. °Signs that your baby has successfully latched on to your nipple: °· Silent tugging or silent sucking, without causing you pain. °· Swallowing heard between every 3-4 sucks. °· Muscle movement above and in front of his or her ears while sucking. °Signs that your baby has not successfully latched on to nipple: °· Sucking sounds or smacking sounds from your baby while breastfeeding. °· Nipple pain. °If you think your baby has not latched on correctly, slip your finger into the corner of your baby's mouth to break the suction and place it between your baby's gums. Attempt breastfeeding initiation again. °Signs of Successful Breastfeeding °Signs from your baby: °· A gradual decrease in the number of sucks or complete cessation of  sucking. °· Falling asleep. °· Relaxation of his or her body. °· Retention of a small amount of milk in his or her mouth. °· Letting go of your breast by himself or herself. °Signs from you: °· Breasts that have increased in firmness, weight, and size 1-3 hours after feeding. °· Breasts that are softer immediately after breastfeeding. °· Increased milk volume, as well as a change in milk consistency and color by the fifth day of breastfeeding. °· Nipples that are not sore, cracked, or bleeding. °Signs That Your Baby is Getting Enough Milk °· Wetting at least 3 diapers in a 24-hour period. The urine should be clear and pale yellow by age 5 days. °· At least 3 stools in a 24-hour period by age   5 days. The stool should be soft and yellow. °· At least 3 stools in a 24-hour period by age 7 days. The stool should be seedy and yellow. °· No loss of weight greater than 10% of birth weight during the first 3 days of age. °· Average weight gain of 4-7 ounces (113-198 g) per week after age 4 days. °· Consistent daily weight gain by age 5 days, without weight loss after the age of 2 weeks. °After a feeding, your baby may spit up a small amount. This is common. °BREASTFEEDING FREQUENCY AND DURATION °Frequent feeding will help you make more milk and can prevent sore nipples and breast engorgement. Breastfeed when you feel the need to reduce the fullness of your breasts or when your baby shows signs of hunger. This is called "breastfeeding on demand." Avoid introducing a pacifier to your baby while you are working to establish breastfeeding (the first 4-6 weeks after your baby is born). After this time you may choose to use a pacifier. Research has shown that pacifier use during the first year of a baby's life decreases the risk of sudden infant death syndrome (SIDS). °Allow your baby to feed on each breast as long as he or she wants. Breastfeed until your baby is finished feeding. When your baby unlatches or falls asleep while  feeding from the first breast, offer the second breast. Because newborns are often sleepy in the first few weeks of life, you may need to awaken your baby to get him or her to feed. °Breastfeeding times will vary from baby to baby. However, the following rules can serve as a guide to help you ensure that your baby is properly fed: °· Newborns (babies 4 weeks of age or younger) may breastfeed every 1-3 hours. °· Newborns should not go longer than 3 hours during the day or 5 hours during the night without breastfeeding. °· You should breastfeed your baby a minimum of 8 times in a 24-hour period until you begin to introduce solid foods to your baby at around 6 months of age. °BREAST MILK PUMPING °Pumping and storing breast milk allows you to ensure that your baby is exclusively fed your breast milk, even at times when you are unable to breastfeed. This is especially important if you are going back to work while you are still breastfeeding or when you are not able to be present during feedings. Your lactation consultant can give you guidelines on how long it is safe to store breast milk. °A breast pump is a machine that allows you to pump milk from your breast into a sterile bottle. The pumped breast milk can then be stored in a refrigerator or freezer. Some breast pumps are operated by hand, while others use electricity. Ask your lactation consultant which type will work best for you. Breast pumps can be purchased, but some hospitals and breastfeeding support groups lease breast pumps on a monthly basis. A lactation consultant can teach you how to hand express breast milk, if you prefer not to use a pump. °CARING FOR YOUR BREASTS WHILE YOU BREASTFEED °Nipples can become dry, cracked, and sore while breastfeeding. The following recommendations can help keep your breasts moisturized and healthy: °· Avoid using soap on your nipples. °· Wear a supportive bra. Although not required, special nursing bras and tank tops are  designed to allow access to your breasts for breastfeeding without taking off your entire bra or top. Avoid wearing underwire-style bras or extremely tight bras. °· Air dry   your nipples for 3-4 minutes after each feeding. °· Use only cotton bra pads to absorb leaked breast milk. Leaking of breast milk between feedings is normal. °· Use lanolin on your nipples after breastfeeding. Lanolin helps to maintain your skin's normal moisture barrier. If you use pure lanolin, you do not need to wash it off before feeding your baby again. Pure lanolin is not toxic to your baby. You may also hand express a few drops of breast milk and gently massage that milk into your nipples and allow the milk to air dry. °In the first few weeks after giving birth, some women experience extremely full breasts (engorgement). Engorgement can make your breasts feel heavy, warm, and tender to the touch. Engorgement peaks within 3-5 days after you give birth. The following recommendations can help ease engorgement: °· Completely empty your breasts while breastfeeding or pumping. You may want to start by applying warm, moist heat (in the shower or with warm water-soaked hand towels) just before feeding or pumping. This increases circulation and helps the milk flow. If your baby does not completely empty your breasts while breastfeeding, pump any extra milk after he or she is finished. °· Wear a snug bra (nursing or regular) or tank top for 1-2 days to signal your body to slightly decrease milk production. °· Apply ice packs to your breasts, unless this is too uncomfortable for you. °· Make sure that your baby is latched on and positioned properly while breastfeeding. °If engorgement persists after 48 hours of following these recommendations, contact your health care provider or a lactation consultant. °OVERALL HEALTH CARE RECOMMENDATIONS WHILE BREASTFEEDING °· Eat healthy foods. Alternate between meals and snacks, eating 3 of each per day. Because  what you eat affects your breast milk, some of the foods may make your baby more irritable than usual. Avoid eating these foods if you are sure that they are negatively affecting your baby. °· Drink milk, fruit juice, and water to satisfy your thirst (about 10 glasses a day). °· Rest often, relax, and continue to take your prenatal vitamins to prevent fatigue, stress, and anemia. °· Continue breast self-awareness checks. °· Avoid chewing and smoking tobacco. Chemicals from cigarettes that pass into breast milk and exposure to secondhand smoke may harm your baby. °· Avoid alcohol and drug use, including marijuana. °Some medicines that may be harmful to your baby can pass through breast milk. It is important to ask your health care provider before taking any medicine, including all over-the-counter and prescription medicine as well as vitamin and herbal supplements. °It is possible to become pregnant while breastfeeding. If birth control is desired, ask your health care provider about options that will be safe for your baby. °SEEK MEDICAL CARE IF: °· You feel like you want to stop breastfeeding or have become frustrated with breastfeeding. °· You have painful breasts or nipples. °· Your nipples are cracked or bleeding. °· Your breasts are red, tender, or warm. °· You have a swollen area on either breast. °· You have a fever or chills. °· You have nausea or vomiting. °· You have drainage other than breast milk from your nipples. °· Your breasts do not become full before feedings by the fifth day after you give birth. °· You feel sad and depressed. °· Your baby is too sleepy to eat well. °· Your baby is having trouble sleeping.   °· Your baby is wetting less than 3 diapers in a 24-hour period. °· Your baby has less than 3 stools in   a 24-hour period. °· Your baby's skin or the white part of his or her eyes becomes yellow.   °· Your baby is not gaining weight by 5 days of age. °SEEK IMMEDIATE MEDICAL CARE IF: °· Your baby  is overly tired (lethargic) and does not want to wake up and feed. °· Your baby develops an unexplained fever. °  °This information is not intended to replace advice given to you by your health care provider. Make sure you discuss any questions you have with your health care provider. °  °Document Released: 10/12/2005 Document Revised: 07/03/2015 Document Reviewed: 04/05/2013 °Elsevier Interactive Patient Education ©2016 [employer]. ° ° °

## 2016-06-14 NOTE — Discharge Summary (Signed)
OB Discharge Summary     Patient Name: Natasha Conway DOB: [birth year] MRN: [id number]  Date of admission: 06/11/2016 Delivering MD: Jacklyn ShellRESENZO-DISHMON, FRANCES   Date of discharge: 06/14/2016  Admitting diagnosis: INDUCTION  LABOR AND DELIVERY ADMISSION HISTORY AND PHYSICAL NOTE  Natasha Conway is a 25 y.o. female G1P0000 with IUP at 4665w0d by LMP presenting for IOL for postdates.  Found to be in active labor on admission with cervix 4.5 cm. She reports positive fetal movement. She denies leakage of fluid or vaginal bleeding. Intrauterine pregnancy: 6247w1d     Secondary diagnosis:  Active Problems:   Post term pregnancy, 41 weeks  Additional problems: active labor on admit for IOL d/t postdates     Discharge diagnosis: Term Pregnancy Delivered                                                                                                Post partum procedures:none  Augmentation: AROM  Complications: None  Hospital course:  Onset of Labor With Vaginal Delivery     25 y.o. yo G1P1001 at 6347w1d was admitted in Active Labor on 06/11/2016. Patient had an uncomplicated labor course as follows:  Membrane Rupture Time/Date: 2:37 PM ,06/11/2016   Intrapartum Procedures: Episiotomy: None [1]                                         Lacerations:  2nd degree [3]  Patient had a delivery of a Viable infant. 06/12/2016  Information for the patient's newborn:  Natasha Conway, Boy Natasha Conway [[id number]][[id number]]  Delivery Method: Vag-Spont  Delivery Note  After a 10 2nd stage, at 0039  a viable female was delivered via  (Presentation: ROA.  APGAR: 8/9; weight pending.  40 units of pitocin diluted in 1000cc LR was infused rapidly IV.  After 2 minutes, the cord was clamped and cut. 40 units of pitocin diluted in 1000cc LR was infused rapidly IV.  The placenta separated spontaneously and delivered via CCT and maternal pushing effort.  It was inspected and appears to be intact with a 3 VC.  There  were the following complications:   Anesthesia: Epidural  Episiotomy: none Lacerations: 2nd degree Suture Repair: 2.0 vicryl Est. Blood Loss (mL): 400  Mom to postpartum.  Baby to Couplet care / Skin to Skin.  Pateint had an uncomplicated postpartum course.  She is ambulating, tolerating a regular diet, passing flatus, and urinating well. Patient is discharged home in stable condition on 06/14/16.    Physical exam Vitals:   06/12/16 1800 06/12/16 2324 06/13/16 0539 06/13/16 1848  BP: 112/64 114/65 114/67 (!) 122/59  Pulse: 88 87 73 85  Resp: 18 16 16 16   Temp: 97.5 F (36.4 C) 97.9 F (36.6 C) 98.2 F (36.8 C) 98 F (36.7 C)  TempSrc: Oral Oral Oral Oral  SpO2:  99%  100%  Weight:      Height:       General: alert, cooperative and no distress Lochia: appropriate Uterine Fundus:  firm Incision: N/A DVT Evaluation: No evidence of DVT seen on physical exam. Negative Homan's sign. No cords or calf tenderness. No significant calf/ankle edema. Labs: Lab Results  Component Value Date   WBC 13.2 (H) 06/11/2016   HGB 14.2 06/11/2016   HCT 39.2 06/11/2016   MCV 90.1 06/11/2016   PLT 209 06/11/2016   No flowsheet data found.  Discharge instruction: per After Visit Summary and "Baby and Me Booklet".  After visit meds:    Medication List    TAKE these medications   ibuprofen 600 MG tablet Commonly known as:  ADVIL,MOTRIN Take 1 tablet (600 mg total) by mouth every 6 (six) hours as needed for mild pain, moderate pain or cramping.   prenatal multivitamin Tabs tablet Take 1 tablet by mouth at bedtime.       Diet: routine diet  Activity: Advance as tolerated. Pelvic rest for 6 weeks.   Outpatient follow up:6 weeks Follow up Appt:No future appointments. Follow up Visit:No Follow-up on file.  Postpartum contraception: abstinence until contraception, undecided at this time- discussed options  Newborn Data: Live born female  Birth Weight: 8 lb 8 oz (3856  g) APGAR: 8, 9  Baby Feeding: Bottle and Breast Disposition:home with mother   06/14/2016 Natasha Conway, Natasha Conway, CNM

## 2016-06-14 NOTE — Progress Notes (Signed)
CSW met with MOB to share information on Medicaid process for baby. CSW provided patient with contact information to [facility] if she has further questions.  Tilden Fossa, LCSW Clinical Social Worker [facility] [phone]

## 2017-07-10 ENCOUNTER — Encounter (HOSPITAL_COMMUNITY): Payer: Self-pay | Admitting: Emergency Medicine

## 2017-07-10 ENCOUNTER — Ambulatory Visit (HOSPITAL_COMMUNITY)
Admission: EM | Admit: 2017-07-10 | Discharge: 2017-07-10 | Disposition: A | Discharge status: Home / Self Care | Payer: 59 | Attending: Internal Medicine | Admitting: Internal Medicine

## 2017-07-10 DIAGNOSIS — J00 Acute nasopharyngitis [common cold]: Secondary | ICD-10-CM

## 2017-07-10 DIAGNOSIS — H1031 Unspecified acute conjunctivitis, right eye: Secondary | ICD-10-CM

## 2017-07-10 MED ORDER — NAPROXEN 500 MG PO TABS: 500.0000 mg | ORAL_TABLET | Freq: Two times a day (BID) | ORAL | 0 refills | Status: AC

## 2017-07-10 MED ORDER — FLUORESCEIN SODIUM 0.6 MG OP STRP
ORAL_STRIP | OPHTHALMIC | Status: AC
  Filled 2017-07-10: qty 1

## 2017-07-10 MED ORDER — CETIRIZINE-PSEUDOEPHEDRINE ER 5-120 MG PO TB12: 1.0000 | ORAL_TABLET | Freq: Every day | ORAL | 0 refills | Status: DC

## 2017-07-10 MED ORDER — FLUTICASONE PROPIONATE 50 MCG/ACT NA SUSP: 2.0000 | Freq: Every day | NASAL | 0 refills | Status: DC

## 2017-07-10 MED ORDER — SULFACETAMIDE SODIUM 10 % OP SOLN: 1.0000 [drp] | OPHTHALMIC | 0 refills | Status: AC

## 2017-07-10 NOTE — ED Triage Notes (Signed)
Pt reports a sore throat and nasal congestion for one week.  For the last two days she has had right eye pain with right head pain and ear pain that goes down into her neck.

## 2017-07-10 NOTE — Discharge Instructions (Signed)
We are covering for bacterial conjunctivitis, take sulfacetamide as directed. Warm compress. Do not wear contact lens for the next 2 weeks. Monitor for any worsening of symptoms, vision changes, sensitivity to light, follow up with ophthalmology for further evaluation.  Start flonase, zyrtec-D for nasal congestion. You can use over the counter nasal saline rinse such as neti pot for nasal congestion. Keep hydrated, your urine should be clear to pale yellow in color. Tylenol/motrin for fever and pain. Monitor for any worsening of symptoms, chest pain, shortness of breath, wheezing, swelling of the throat, follow up for reevaluation.

## 2017-07-10 NOTE — ED Provider Notes (Signed)
[facility]    CSN: [id number] Arrival date & time: 07/10/17  1333     History   Chief Complaint Chief Complaint  Patient presents with  . Headache    HPI Natasha Conway is a 26 y.o. female.   26 year old female comes in for 1 week history of sore throat, nasal congestion. She has also had 2 days of right eye redness/eyelid swelling and headache. Patient states headache is all around pressure, which is worse around the right eye and radiates down the neck. Right eye redness and pain with some watery discharge. Eye is crusted shut in the mornings. Patient recently started trying contact lens, but has not been adjusting and has discontinued for the past week. Denies vision changes, photophobia. Also has some right ear pain. Denies abdominal pain, nausea, vomiting, diarrhea, constipation. Has had subjective fever. Has had cough, sneezing, rhinorrhea. Cough is none productive. Has been taking advil for her symptoms without releif. No sick contact. Denies CP, SOB, wheezing. Denies history of seasonal allergies, asthma, smoking.       Past Medical History:  Diagnosis Date  . Medical history non-contributory     Patient Active Problem List   Diagnosis Date Noted  . Post term pregnancy, 41 weeks 06/11/2016    Past Surgical History:  Procedure Laterality Date  . NO PAST SURGERIES    . WISDOM TOOTH EXTRACTION      OB History    Gravida Para Term Preterm AB Living   0 0 1   SAB TAB Ectopic Multiple Live Births   0 0 0 0 1       Home Medications    Prior to Admission medications   Medication Sig Start Date End Date Taking? Authorizing Provider  ibuprofen (ADVIL,MOTRIN) 600 MG tablet Take 1 tablet (600 mg total) by mouth every 6 (six) hours as needed for mild pain, moderate pain or cramping. 06/14/16  Yes Booker, Merlene Laughter, CNM  cetirizine-pseudoephedrine (ZYRTEC-D) 5-120 MG tablet Take 1 tablet by mouth daily. 07/10/17   Cathie Hoops, Amy V, PA-C    fluticasone (FLONASE) 50 MCG/ACT nasal spray Place 2 sprays into both nostrils daily. 07/10/17   Cathie Hoops, Amy V, PA-C  naproxen (NAPROSYN) 500 MG tablet Take 1 tablet (500 mg total) by mouth 2 (two) times daily. 07/10/17 07/20/17  Belinda Fisher, PA-C  Prenatal Vit-Fe Fumarate-FA (PRENATAL MULTIVITAMIN) TABS tablet Take 1 tablet by mouth at bedtime.    [provider]  sulfacetamide (BLEPH-10) 10 % ophthalmic solution Place 1-2 drops into the right eye every 4 (four) hours. 07/10/17 07/15/17  Belinda Fisher, PA-C    Family History History reviewed. No pertinent family history.  Social History Social History  Substance Use Topics  . Smoking status: Never Smoker  . Smokeless tobacco: Never Used  . Alcohol use No     Allergies   Patient has no known allergies.   Review of Systems Review of Systems  Reason unable to perform ROS: See HPI as above.     Physical Exam Triage Vital Signs ED Triage Vitals  Enc Vitals Group     BP 07/10/17 1438 (!) 96/58     Pulse Rate 07/10/17 1438 68     Resp --      Temp 07/10/17 1438 99.2 F (37.3 C)     Temp Source 07/10/17 1438 Oral     SpO2 07/10/17 1438 100 %     Weight --  Height --      Head Circumference --      Peak Flow --      Pain Score 07/10/17 1439 7     Pain Loc --      Pain Edu? --      Excl. in GC? --    No data found.   Updated Vital Signs BP (!) 96/58 (BP Location: Left Arm)   Pulse 68   Temp 99.2 F (37.3 C) (Oral)   LMP 06/26/2017 (Exact Date)   SpO2 100%   Visual Acuity Right Eye Distance: (S) 20/20 w/corrections Left Eye Distance: (S) 20/20 w/corrections Bilateral Distance: (S) 20/20 w/corrections  Right Eye Near:   Left Eye Near:    Bilateral Near:     Physical Exam  Constitutional: She is oriented to person, place, and time. She appears well-developed and well-nourished. No distress.  HENT:  Head: Normocephalic and atraumatic.  Right Ear: External ear and ear canal normal. Tympanic membrane is  erythematous. Tympanic membrane is not bulging.  Left Ear: External ear and ear canal normal. Tympanic membrane is erythematous. Tympanic membrane is not bulging.  Nose: Mucosal edema and rhinorrhea present. Right sinus exhibits no maxillary sinus tenderness and no frontal sinus tenderness. Left sinus exhibits no maxillary sinus tenderness and no frontal sinus tenderness.  Mouth/Throat: Uvula is midline and mucous membranes are normal. Posterior oropharyngeal erythema present.  Eyes: Pupils are equal, round, and reactive to light. EOM and lids are normal. Lids are everted and swept, no foreign bodies found. Right conjunctiva is injected. Left conjunctiva is not injected.  Fluorescein stain without uptake.   Neck: Normal range of motion. Neck supple.  Cardiovascular: Normal rate, regular rhythm and normal heart sounds.  Exam reveals no gallop and no friction rub.   No murmur heard. Pulmonary/Chest: Effort normal and breath sounds normal. She has no decreased breath sounds. She has no wheezes. She has no rhonchi. She has no rales.  Lymphadenopathy:    She has no cervical adenopathy.  Neurological: She is alert and oriented to person, place, and time.  Skin: Skin is warm and dry.  Psychiatric: She has a normal mood and affect. Her behavior is normal. Judgment normal.     UC Treatments / Results  Labs (all labs ordered are listed, but only abnormal results are displayed) Labs Reviewed - No data to display  EKG  EKG Interpretation None       Radiology No results found.  Procedures Procedures (including critical care time)  Medications Ordered in UC Medications - No data to display   Initial Impression / Assessment and Plan / UC Course  I have reviewed the triage vital signs and the nursing notes.  Pertinent labs & imaging results that were available during my care of the patient were reviewed by me and considered in my medical decision making (see chart for details).    Given  patient with crusting in the morning, and has been attempting to wear contacts, will treat for bacterial conjunctivitis. Given patient has not successfully worn contacts for long, will treat with sulfacetamide. Discussed with patient this could also be due to viral conjunctivitis given current URI symptoms. Patient to avoid contact lens use for the next 2 weeks. Symptomatic treatment for viral URI. Push fluids. Follow up with ophthalmology if symptoms not improving. Return precautions given.   Final Clinical Impressions(s) / UC Diagnoses   Final diagnoses:  Acute nasopharyngitis  Acute conjunctivitis of right eye, unspecified acute conjunctivitis type  New Prescriptions Discharge Medication List as of 07/10/2017  3:49 PM    START taking these medications   Details  cetirizine-pseudoephedrine (ZYRTEC-D) 5-120 MG tablet Take 1 tablet by mouth daily., Starting Sat 07/10/2017, Normal    fluticasone (FLONASE) 50 MCG/ACT nasal spray Place 2 sprays into both nostrils daily., Starting Sat 07/10/2017, Normal    naproxen (NAPROSYN) 500 MG tablet Take 1 tablet (500 mg total) by mouth 2 (two) times daily., Starting Sat 07/10/2017, Until Tue 07/20/2017, Normal    sulfacetamide (BLEPH-10) 10 % ophthalmic solution Place 1-2 drops into the right eye every 4 (four) hours., Starting Sat 07/10/2017, Until Thu 07/15/2017, Normal          Belinda Fisher, PA-C 07/10/17 1645

## 2018-02-23 ENCOUNTER — Other Ambulatory Visit (HOSPITAL_COMMUNITY): Payer: Self-pay | Admitting: Obstetrics and Gynecology

## 2018-02-23 DIAGNOSIS — R1011 Right upper quadrant pain: Secondary | ICD-10-CM

## 2018-02-25 ENCOUNTER — Ambulatory Visit (HOSPITAL_COMMUNITY): Payer: 59 | Attending: Obstetrics and Gynecology

## 2020-07-11 DIAGNOSIS — Z01419 Encounter for gynecological examination (general) (routine) without abnormal findings: Secondary | ICD-10-CM | POA: Diagnosis not present

## 2020-07-11 DIAGNOSIS — Z6841 Body Mass Index (BMI) 40.0 and over, adult: Secondary | ICD-10-CM | POA: Diagnosis not present

## 2020-07-11 DIAGNOSIS — R1031 Right lower quadrant pain: Secondary | ICD-10-CM | POA: Diagnosis not present

## 2020-07-25 DIAGNOSIS — Z1389 Encounter for screening for other disorder: Secondary | ICD-10-CM | POA: Diagnosis not present

## 2020-07-25 DIAGNOSIS — Z13220 Encounter for screening for lipoid disorders: Secondary | ICD-10-CM | POA: Diagnosis not present

## 2020-07-25 DIAGNOSIS — Z1329 Encounter for screening for other suspected endocrine disorder: Secondary | ICD-10-CM | POA: Diagnosis not present

## 2020-07-25 DIAGNOSIS — Z0000 Encounter for general adult medical examination without abnormal findings: Secondary | ICD-10-CM | POA: Diagnosis not present

## 2020-07-25 DIAGNOSIS — R9389 Abnormal findings on diagnostic imaging of other specified body structures: Secondary | ICD-10-CM | POA: Diagnosis not present

## 2020-07-25 DIAGNOSIS — N859 Noninflammatory disorder of uterus, unspecified: Secondary | ICD-10-CM | POA: Diagnosis not present

## 2020-07-25 DIAGNOSIS — Z131 Encounter for screening for diabetes mellitus: Secondary | ICD-10-CM | POA: Diagnosis not present

## 2020-07-25 DIAGNOSIS — Z130 Encounter for screening for diseases of the blood and blood-forming organs and certain disorders involving the immune mechanism: Secondary | ICD-10-CM | POA: Diagnosis not present

## 2020-07-25 DIAGNOSIS — N8500 Endometrial hyperplasia, unspecified: Secondary | ICD-10-CM | POA: Diagnosis not present

## 2020-07-25 DIAGNOSIS — R102 Pelvic and perineal pain: Secondary | ICD-10-CM | POA: Diagnosis not present

## 2020-09-03 DIAGNOSIS — Z23 Encounter for immunization: Secondary | ICD-10-CM | POA: Diagnosis not present

## 2020-09-10 DIAGNOSIS — Z3200 Encounter for pregnancy test, result unknown: Secondary | ICD-10-CM | POA: Diagnosis not present

## 2020-09-18 DIAGNOSIS — Z3201 Encounter for pregnancy test, result positive: Secondary | ICD-10-CM | POA: Diagnosis not present

## 2020-10-03 DIAGNOSIS — R519 Headache, unspecified: Secondary | ICD-10-CM | POA: Diagnosis not present

## 2020-10-03 DIAGNOSIS — Z20828 Contact with and (suspected) exposure to other viral communicable diseases: Secondary | ICD-10-CM | POA: Diagnosis not present

## 2020-10-03 DIAGNOSIS — R059 Cough, unspecified: Secondary | ICD-10-CM | POA: Diagnosis not present

## 2020-10-03 DIAGNOSIS — J329 Chronic sinusitis, unspecified: Secondary | ICD-10-CM | POA: Diagnosis not present

## 2020-10-07 DIAGNOSIS — Z3481 Encounter for supervision of other normal pregnancy, first trimester: Secondary | ICD-10-CM | POA: Diagnosis not present

## 2020-10-26 NOTE — L&D Delivery Note (Addendum)
Delivery Note At 6:01 PM a viable and healthy female was delivered via Vaginal, Spontaneous (Presentation:   Occiput Anterior).  APGAR: 8, 9; weight 6 lb 2.4 oz (2790 g).   Placenta status: Spontaneous, Intact.  Cord: 3 vessels with the following complications: None.  Cord pH: N/A  Anesthesia: Epidural Episiotomy: None Lacerations: 2nd degree;Perineal Suture Repair: 3.0 Est. Blood Loss (mL): 675  Mom to postpartum.  Baby to Couplet care / Skin to Skin. Mother had a suspected III, fever of 101.2 and baby had tachycardia close to delivery. If fever persists, will continue with abx.   Alfredo Martinez, MD, PGY1 04/27/2021, 7:38 PM

## 2020-11-07 ENCOUNTER — Other Ambulatory Visit: Payer: Self-pay | Admitting: Family Medicine

## 2020-11-07 DIAGNOSIS — Z363 Encounter for antenatal screening for malformations: Secondary | ICD-10-CM

## 2020-11-07 LAB — OB RESULTS CONSOLE ABO/RH: RH Type: POSITIVE

## 2020-11-07 LAB — HEPATITIS C ANTIBODY: HCV Ab: NEGATIVE

## 2020-11-07 LAB — OB RESULTS CONSOLE ANTIBODY SCREEN: Antibody Screen: NEGATIVE

## 2020-11-07 LAB — GLUCOSE TOLERANCE, 1 HOUR: Glucose, 1 Hour GTT: 73

## 2020-12-10 ENCOUNTER — Ambulatory Visit: Payer: Medicaid Other | Attending: Obstetrics and Gynecology

## 2020-12-10 ENCOUNTER — Other Ambulatory Visit: Payer: Self-pay

## 2020-12-10 DIAGNOSIS — E669 Obesity, unspecified: Secondary | ICD-10-CM | POA: Diagnosis not present

## 2020-12-10 DIAGNOSIS — O99891 Other specified diseases and conditions complicating pregnancy: Secondary | ICD-10-CM

## 2020-12-10 DIAGNOSIS — Z3A19 19 weeks gestation of pregnancy: Secondary | ICD-10-CM

## 2020-12-10 DIAGNOSIS — N870 Mild cervical dysplasia: Secondary | ICD-10-CM

## 2020-12-10 DIAGNOSIS — Z363 Encounter for antenatal screening for malformations: Secondary | ICD-10-CM | POA: Insufficient documentation

## 2020-12-10 DIAGNOSIS — O98312 Other infections with a predominantly sexual mode of transmission complicating pregnancy, second trimester: Secondary | ICD-10-CM

## 2020-12-10 DIAGNOSIS — A568 Sexually transmitted chlamydial infection of other sites: Secondary | ICD-10-CM | POA: Diagnosis not present

## 2020-12-10 DIAGNOSIS — O99212 Obesity complicating pregnancy, second trimester: Secondary | ICD-10-CM

## 2020-12-11 ENCOUNTER — Other Ambulatory Visit: Payer: Self-pay | Admitting: *Deleted

## 2020-12-11 DIAGNOSIS — Z362 Encounter for other antenatal screening follow-up: Secondary | ICD-10-CM

## 2021-01-03 ENCOUNTER — Encounter: Payer: Self-pay | Admitting: *Deleted

## 2021-01-06 ENCOUNTER — Other Ambulatory Visit: Payer: Self-pay

## 2021-01-07 ENCOUNTER — Ambulatory Visit (HOSPITAL_BASED_OUTPATIENT_CLINIC_OR_DEPARTMENT_OTHER): Payer: Medicaid Other

## 2021-01-07 ENCOUNTER — Ambulatory Visit: Payer: Medicaid Other | Attending: Obstetrics and Gynecology | Admitting: *Deleted

## 2021-01-07 ENCOUNTER — Other Ambulatory Visit: Payer: Self-pay

## 2021-01-07 ENCOUNTER — Encounter: Payer: Self-pay | Admitting: *Deleted

## 2021-01-07 VITALS — BP 106/54 | HR 80

## 2021-01-07 DIAGNOSIS — Z362 Encounter for other antenatal screening follow-up: Secondary | ICD-10-CM

## 2021-01-07 DIAGNOSIS — E669 Obesity, unspecified: Secondary | ICD-10-CM | POA: Diagnosis not present

## 2021-01-07 DIAGNOSIS — Z363 Encounter for antenatal screening for malformations: Secondary | ICD-10-CM | POA: Insufficient documentation

## 2021-01-07 DIAGNOSIS — A568 Sexually transmitted chlamydial infection of other sites: Secondary | ICD-10-CM | POA: Diagnosis not present

## 2021-01-07 DIAGNOSIS — Z6841 Body Mass Index (BMI) 40.0 and over, adult: Secondary | ICD-10-CM

## 2021-01-07 DIAGNOSIS — Z3A23 23 weeks gestation of pregnancy: Secondary | ICD-10-CM | POA: Diagnosis not present

## 2021-01-07 DIAGNOSIS — O99212 Obesity complicating pregnancy, second trimester: Secondary | ICD-10-CM | POA: Diagnosis present

## 2021-01-07 DIAGNOSIS — O2692 Pregnancy related conditions, unspecified, second trimester: Secondary | ICD-10-CM | POA: Insufficient documentation

## 2021-01-07 DIAGNOSIS — O99891 Other specified diseases and conditions complicating pregnancy: Secondary | ICD-10-CM

## 2021-01-07 DIAGNOSIS — N871 Moderate cervical dysplasia: Secondary | ICD-10-CM

## 2021-01-07 DIAGNOSIS — O98312 Other infections with a predominantly sexual mode of transmission complicating pregnancy, second trimester: Secondary | ICD-10-CM | POA: Diagnosis not present

## 2021-01-08 ENCOUNTER — Other Ambulatory Visit: Payer: Self-pay | Admitting: *Deleted

## 2021-01-08 DIAGNOSIS — Z6841 Body Mass Index (BMI) 40.0 and over, adult: Secondary | ICD-10-CM

## 2021-02-05 ENCOUNTER — Ambulatory Visit: Payer: Medicaid Other | Admitting: *Deleted

## 2021-02-05 ENCOUNTER — Other Ambulatory Visit: Payer: Self-pay

## 2021-02-05 ENCOUNTER — Ambulatory Visit: Payer: Medicaid Other | Attending: Obstetrics

## 2021-02-05 ENCOUNTER — Encounter: Payer: Self-pay | Admitting: *Deleted

## 2021-02-05 VITALS — BP 112/61 | HR 83

## 2021-02-05 DIAGNOSIS — Z3A27 27 weeks gestation of pregnancy: Secondary | ICD-10-CM | POA: Diagnosis not present

## 2021-02-05 DIAGNOSIS — A749 Chlamydial infection, unspecified: Secondary | ICD-10-CM | POA: Diagnosis not present

## 2021-02-05 DIAGNOSIS — O98312 Other infections with a predominantly sexual mode of transmission complicating pregnancy, second trimester: Secondary | ICD-10-CM | POA: Diagnosis not present

## 2021-02-05 DIAGNOSIS — E669 Obesity, unspecified: Secondary | ICD-10-CM | POA: Diagnosis not present

## 2021-02-05 DIAGNOSIS — Z6841 Body Mass Index (BMI) 40.0 and over, adult: Secondary | ICD-10-CM

## 2021-02-05 DIAGNOSIS — O3442 Maternal care for other abnormalities of cervix, second trimester: Secondary | ICD-10-CM

## 2021-02-05 DIAGNOSIS — O99212 Obesity complicating pregnancy, second trimester: Secondary | ICD-10-CM | POA: Insufficient documentation

## 2021-02-05 DIAGNOSIS — N871 Moderate cervical dysplasia: Secondary | ICD-10-CM

## 2021-02-06 ENCOUNTER — Other Ambulatory Visit: Payer: Self-pay | Admitting: *Deleted

## 2021-02-06 DIAGNOSIS — R638 Other symptoms and signs concerning food and fluid intake: Secondary | ICD-10-CM

## 2021-02-10 ENCOUNTER — Telehealth: Payer: Self-pay

## 2021-02-10 LAB — GLUCOSE TOLERANCE, 1 HOUR: Glucose, 1 Hour GTT: 133

## 2021-02-10 LAB — OB RESULTS CONSOLE RPR: RPR: NONREACTIVE

## 2021-02-10 LAB — OB RESULTS CONSOLE HIV ANTIBODY (ROUTINE TESTING): HIV: NONREACTIVE

## 2021-02-10 LAB — OB RESULTS CONSOLE HGB/HCT, BLOOD
HCT: 39 (ref 29–41)
Hemoglobin: 13.2

## 2021-02-10 NOTE — Telephone Encounter (Signed)
Mar/Fetal echo order faxed to [facility].

## 2021-02-18 ENCOUNTER — Telehealth: Payer: Self-pay

## 2021-02-18 NOTE — Telephone Encounter (Signed)
Patient scheduled for a Fetal Echo at [facility] 02/27/21 at 9am

## 2021-02-20 ENCOUNTER — Encounter: Payer: Self-pay | Admitting: Pediatric Cardiology

## 2021-02-21 LAB — GLUCOSE TOLERANCE, 3 HOURS
Glucose, GTT - 1 Hour: 118 (ref ?–200)
Glucose, GTT - 2 Hour: 80 (ref ?–140)
Glucose, GTT - 3 Hour: 89 mg/dL (ref ?–140)
Glucose, GTT - Fasting: 117 mg/dL — AB (ref 80–110)

## 2021-03-05 ENCOUNTER — Other Ambulatory Visit: Payer: Self-pay | Admitting: Obstetrics

## 2021-03-05 ENCOUNTER — Ambulatory Visit: Payer: Medicaid Other | Admitting: *Deleted

## 2021-03-05 ENCOUNTER — Other Ambulatory Visit: Payer: Self-pay

## 2021-03-05 ENCOUNTER — Ambulatory Visit: Payer: Medicaid Other | Attending: Obstetrics

## 2021-03-05 ENCOUNTER — Encounter: Payer: Self-pay | Admitting: *Deleted

## 2021-03-05 VITALS — BP 105/53 | HR 79

## 2021-03-05 DIAGNOSIS — Z6841 Body Mass Index (BMI) 40.0 and over, adult: Secondary | ICD-10-CM | POA: Insufficient documentation

## 2021-03-05 DIAGNOSIS — O365921 Maternal care for other known or suspected poor fetal growth, second trimester, fetus 1: Secondary | ICD-10-CM

## 2021-03-05 DIAGNOSIS — R638 Other symptoms and signs concerning food and fluid intake: Secondary | ICD-10-CM

## 2021-03-06 DIAGNOSIS — A562 Chlamydial infection of genitourinary tract, unspecified: Secondary | ICD-10-CM

## 2021-03-06 DIAGNOSIS — O99213 Obesity complicating pregnancy, third trimester: Secondary | ICD-10-CM | POA: Diagnosis not present

## 2021-03-06 DIAGNOSIS — Z362 Encounter for other antenatal screening follow-up: Secondary | ICD-10-CM

## 2021-03-06 DIAGNOSIS — N871 Moderate cervical dysplasia: Secondary | ICD-10-CM

## 2021-03-06 DIAGNOSIS — Z6831 Body mass index (BMI) 31.0-31.9, adult: Secondary | ICD-10-CM

## 2021-03-06 DIAGNOSIS — E669 Obesity, unspecified: Secondary | ICD-10-CM

## 2021-03-06 DIAGNOSIS — O3443 Maternal care for other abnormalities of cervix, third trimester: Secondary | ICD-10-CM

## 2021-03-06 DIAGNOSIS — O365930 Maternal care for other known or suspected poor fetal growth, third trimester, not applicable or unspecified: Secondary | ICD-10-CM | POA: Diagnosis not present

## 2021-03-06 DIAGNOSIS — O9833 Other infections with a predominantly sexual mode of transmission complicating the puerperium: Secondary | ICD-10-CM

## 2021-03-07 ENCOUNTER — Other Ambulatory Visit: Payer: Self-pay | Admitting: *Deleted

## 2021-03-07 DIAGNOSIS — O365990 Maternal care for other known or suspected poor fetal growth, unspecified trimester, not applicable or unspecified: Secondary | ICD-10-CM

## 2021-03-12 ENCOUNTER — Encounter: Payer: Self-pay | Admitting: *Deleted

## 2021-03-12 ENCOUNTER — Other Ambulatory Visit: Payer: Self-pay

## 2021-03-12 ENCOUNTER — Ambulatory Visit: Payer: Medicaid Other | Admitting: *Deleted

## 2021-03-12 ENCOUNTER — Ambulatory Visit: Payer: Medicaid Other | Attending: Maternal & Fetal Medicine

## 2021-03-12 VITALS — BP 123/73 | HR 81

## 2021-03-12 DIAGNOSIS — E669 Obesity, unspecified: Secondary | ICD-10-CM | POA: Diagnosis not present

## 2021-03-12 DIAGNOSIS — O365930 Maternal care for other known or suspected poor fetal growth, third trimester, not applicable or unspecified: Secondary | ICD-10-CM | POA: Insufficient documentation

## 2021-03-12 DIAGNOSIS — N870 Mild cervical dysplasia: Secondary | ICD-10-CM

## 2021-03-12 DIAGNOSIS — O99212 Obesity complicating pregnancy, second trimester: Secondary | ICD-10-CM | POA: Diagnosis not present

## 2021-03-12 DIAGNOSIS — O365990 Maternal care for other known or suspected poor fetal growth, unspecified trimester, not applicable or unspecified: Secondary | ICD-10-CM | POA: Insufficient documentation

## 2021-03-12 DIAGNOSIS — Z3A32 32 weeks gestation of pregnancy: Secondary | ICD-10-CM

## 2021-03-12 DIAGNOSIS — A749 Chlamydial infection, unspecified: Secondary | ICD-10-CM

## 2021-03-12 DIAGNOSIS — O3443 Maternal care for other abnormalities of cervix, third trimester: Secondary | ICD-10-CM

## 2021-03-12 DIAGNOSIS — O98313 Other infections with a predominantly sexual mode of transmission complicating pregnancy, third trimester: Secondary | ICD-10-CM

## 2021-03-19 ENCOUNTER — Ambulatory Visit: Payer: Medicaid Other

## 2021-03-19 ENCOUNTER — Encounter: Payer: Self-pay | Admitting: *Deleted

## 2021-03-19 ENCOUNTER — Ambulatory Visit: Payer: Medicaid Other | Attending: Maternal & Fetal Medicine

## 2021-03-19 ENCOUNTER — Other Ambulatory Visit: Payer: Self-pay

## 2021-03-19 ENCOUNTER — Encounter: Payer: Self-pay | Admitting: General Practice

## 2021-03-19 DIAGNOSIS — O365990 Maternal care for other known or suspected poor fetal growth, unspecified trimester, not applicable or unspecified: Secondary | ICD-10-CM

## 2021-03-25 ENCOUNTER — Other Ambulatory Visit: Payer: Self-pay

## 2021-03-25 ENCOUNTER — Encounter: Payer: Self-pay | Admitting: Obstetrics and Gynecology

## 2021-03-25 ENCOUNTER — Ambulatory Visit (INDEPENDENT_AMBULATORY_CARE_PROVIDER_SITE_OTHER): Payer: Medicaid Other | Admitting: Obstetrics and Gynecology

## 2021-03-25 VITALS — BP 115/63 | HR 76 | Wt 251.9 lb

## 2021-03-25 DIAGNOSIS — O0990 Supervision of high risk pregnancy, unspecified, unspecified trimester: Secondary | ICD-10-CM | POA: Insufficient documentation

## 2021-03-25 DIAGNOSIS — Z3A34 34 weeks gestation of pregnancy: Secondary | ICD-10-CM

## 2021-03-25 DIAGNOSIS — O365990 Maternal care for other known or suspected poor fetal growth, unspecified trimester, not applicable or unspecified: Secondary | ICD-10-CM

## 2021-03-25 MED ORDER — BLOOD PRESSURE KIT DEVI: 1.0000 | 0 refills | Status: DC

## 2021-03-25 NOTE — Patient Instructions (Signed)
How a Baby Grows During Pregnancy Pregnancy begins when a female's sperm enters a female's egg. This is called fertilization. Fertilization usually happens in one of the fallopian tubes that connect the ovaries to the uterus. The fertilized egg moves down the fallopian tube to the uterus. Once it reaches the uterus, it implants into the lining of the uterus and begins to grow. For the first 8 weeks, the fertilized egg is called an embryo. After 8 weeks, it is called a fetus. As the fetus continues to grow, it receives oxygen and nutrients through the placenta, which is an organ that grows to support the developing baby. The placenta is the life support system for the baby. It provides oxygen and nutrition and removes waste. How long does a typical pregnancy last? A pregnancy usually lasts 280 days, or about 40 weeks. Pregnancy is divided into three periods of growth, also called trimesters:  First trimester: 0-12 weeks.  Second trimester: 13-27 weeks.  Third trimester: 28-40 weeks. The day when your baby is ready to be born (full term) is your estimated date of delivery. However, most babies are not born on their estimated date of delivery. How does my baby develop month by month? First month  The fertilized egg attaches to the inside of the uterus.  Some cells will form the placenta. Others will form the fetus.  The arms, legs, brain, spinal cord, lungs, and heart begin to develop.  At the end of the first month, the heart begins to beat. Second month  The bones, inner ear, eyelids, hands, and feet form.  The genitals develop.  By the end of 8 weeks, all major organs are developing. Third month  All of the internal organs are forming.  Teeth develop below the gums.  Bones and muscles begin to grow. The spine can flex.  The skin is transparent.  Fingernails and toenails begin to form.  Arms and legs continue to grow longer, and hands and feet develop.  The fetus is about 3  inches (7.6 cm) long. Fourth month  The placenta is completely formed.  The external sex organs, neck, outer ear, eyebrows, eyelids, and fingernails are formed.  The fetus can hear, swallow, and move its arms and legs.  The kidneys begin to produce urine.  The skin is covered with a white, waxy coating (vernix) and very fine hair (lanugo). Fifth month  The fetus moves around more and can be felt for the first time (quickening).  The fetus starts to sleep and wake up and may begin to suck a finger.  The nails grow to the end of the fingers.  The organ in the digestive system that makes bile (gallbladder) functions and helps to digest nutrients.  If the fetus is a female, eggs are present in the ovaries. If the fetus is a female, testicles start to move down into the scrotum. Sixth month  The lungs are formed.  The eyes open. The brain continues to develop.  Your baby has fingerprints and toe prints. Your baby's hair grows thicker.  At the end of the second trimester, the fetus is about 9 inches (22.9 cm) long. Seventh month  The fetus kicks and stretches.  The eyes are developed enough to sense changes in light.  The hands can make a grasping motion.  The fetus responds to sound. Eighth month  Most organs and body systems are fully developed and functioning.  Bones harden, and taste buds develop. The fetus may hiccup.  Certain areas  of the brain are still developing. The skull remains soft. Ninth month  The fetus gains about  lb (0.23 kg) each week.  The lungs are fully developed.  Patterns of sleep develop.  The fetus's head typically moves into a head-down position (vertex) in the uterus to prepare for birth.  The fetus weighs 6-9 lb (2.72-4.08 kg) and is 19-20 inches (48.26-50.8 cm) long.   How do I know if my baby is developing well? Always talk with your health care provider about any concerns that you may have about your pregnancy and your baby. At each  prenatal visit, your health care provider will do several different tests to check on your health and keep track of your baby's development. These include:  Fundal height and position. To do this, your health care provider will: ? Measure your growing belly from your pubic bone to the top of the uterus using a tape measure. ? Feel your belly to determine your baby's position.  Heartbeat. An ultrasound in the first trimester can confirm pregnancy and show a heartbeat, depending on how far along you are. Your health care provider will check your baby's heart rate at every prenatal visit. You will also have a second trimester ultrasound to check your baby's development. Follow these instructions at home:  Take prenatal vitamins as told by your health care provider. These include vitamins such as folic acid, iron, calcium, and vitamin D. They are important for healthy development.  Take over-the-counter and prescription medicines only as told by your health care provider.  Keep all follow-up visits. This is important. Follow-up visits include prenatal care and screening tests. Summary  A pregnancy usually lasts 280 days, or about 40 weeks. Pregnancy is divided into three periods of growth, also called trimesters.  Your health care provider will monitor your baby's growth and development throughout your pregnancy.  Follow your health care provider's recommendations about taking prenatal vitamins and medicines during your pregnancy.  Talk with your health care provider if you have any concerns about your pregnancy or your developing baby. This information is not intended to replace advice given to you by your health care provider. Make sure you discuss any questions you have with your health care provider. Document Revised: 03/20/2020 Document Reviewed: 01/25/2020 Elsevier Patient Education  2021 Elsevier Inc.  

## 2021-03-25 NOTE — Progress Notes (Signed)
   PRENATAL VISIT NOTE  Subjective:  Natasha Conway is a 30 y.o. G2P1001 at [redacted]w[redacted]d being seen today for ongoing prenatal care.  She is currently monitored for the following issues for this high-risk pregnancy and has Post term pregnancy, 41 weeks and Supervision of high risk pregnancy, antepartum on their problem list.   Patient is a transfer from [facility] for fetal growth restriction. Reviewed OB history. Vaginal delivery in 2017. Pelvis tested to 8lb 8oz.   Patient reports no complaints.  Contractions: Irritability. Vag. Bleeding: None.  Movement: Present. Denies leaking of fluid.   The following portions of the patient's history were reviewed and updated as appropriate: allergies, current medications, past family history, past medical history, past social history, past surgical history and problem list.   Objective:   Vitals:   03/25/21 1511  BP: 115/63  Pulse: 76  Weight: 251 lb 14.4 oz (114.3 kg)    Fetal Status: Fetal Heart Rate (bpm): 142   Movement: Present     General:  Alert, oriented and cooperative. Patient is in no acute distress.  Skin: Skin is warm and dry. No rash noted.   Cardiovascular: Normal heart rate noted  Respiratory: Normal respiratory effort, no problems with respiration noted  Abdomen: Soft, gravid, appropriate for gestational age.  Pain/Pressure: Present     Pelvic: Cervical exam deferred        Extremities: Normal range of motion.  Edema: Trace  Mental Status: Normal mood and affect. Normal behavior. Normal judgment and thought content.   Assessment and Plan:  Pregnancy: G2P1001 at [redacted]w[redacted]d 1. Supervision of high risk pregnancy, antepartum - OB intake done today. Welcomed to practice and oriented.  - [facility] AMB BABYSCRIPTS SCHEDULE OPTIMIZATION  2. Pregnancy affected by fetal growth restriction -EFW 9 %ile, has antenatal testing ordered already   3. [redacted] weeks gestation of pregnancy    Preterm labor symptoms and general obstetric precautions  including but not limited to vaginal bleeding, contractions, leaking of fluid and fetal movement were reviewed in detail with the patient. Please refer to After Visit Summary for other counseling recommendations.   No follow-ups on file.  Future Appointments  Date Time Provider Department Center  03/26/2021  3:30 PM [facility] NURSE [facility]  03/26/2021  3:45 PM [facility] US6 [facility]  04/02/2021  7:30 AM [facility] NURSE [facility]  04/02/2021  7:45 AM [facility] US5 [facility]    Gita Kudo, MD

## 2021-03-26 ENCOUNTER — Encounter: Payer: Self-pay | Admitting: *Deleted

## 2021-03-26 ENCOUNTER — Ambulatory Visit: Payer: Medicaid Other | Attending: Maternal & Fetal Medicine

## 2021-03-26 ENCOUNTER — Ambulatory Visit: Payer: Medicaid Other | Admitting: *Deleted

## 2021-03-26 DIAGNOSIS — O0990 Supervision of high risk pregnancy, unspecified, unspecified trimester: Secondary | ICD-10-CM | POA: Diagnosis present

## 2021-03-26 DIAGNOSIS — O365990 Maternal care for other known or suspected poor fetal growth, unspecified trimester, not applicable or unspecified: Secondary | ICD-10-CM | POA: Insufficient documentation

## 2021-04-02 ENCOUNTER — Ambulatory Visit: Payer: Medicaid Other | Attending: Maternal & Fetal Medicine

## 2021-04-02 ENCOUNTER — Other Ambulatory Visit: Payer: Self-pay

## 2021-04-02 ENCOUNTER — Other Ambulatory Visit: Payer: Self-pay | Admitting: *Deleted

## 2021-04-02 ENCOUNTER — Encounter: Payer: Self-pay | Admitting: *Deleted

## 2021-04-02 ENCOUNTER — Ambulatory Visit: Payer: Medicaid Other | Admitting: *Deleted

## 2021-04-02 DIAGNOSIS — O359XX0 Maternal care for (suspected) fetal abnormality and damage, unspecified, not applicable or unspecified: Secondary | ICD-10-CM

## 2021-04-02 DIAGNOSIS — O98313 Other infections with a predominantly sexual mode of transmission complicating pregnancy, third trimester: Secondary | ICD-10-CM

## 2021-04-02 DIAGNOSIS — O365930 Maternal care for other known or suspected poor fetal growth, third trimester, not applicable or unspecified: Secondary | ICD-10-CM

## 2021-04-02 DIAGNOSIS — O0990 Supervision of high risk pregnancy, unspecified, unspecified trimester: Secondary | ICD-10-CM | POA: Diagnosis present

## 2021-04-02 DIAGNOSIS — Z749 Problem related to care provider dependency, unspecified: Secondary | ICD-10-CM

## 2021-04-02 DIAGNOSIS — O99213 Obesity complicating pregnancy, third trimester: Secondary | ICD-10-CM

## 2021-04-02 DIAGNOSIS — O365990 Maternal care for other known or suspected poor fetal growth, unspecified trimester, not applicable or unspecified: Secondary | ICD-10-CM | POA: Diagnosis present

## 2021-04-02 DIAGNOSIS — Z3A35 35 weeks gestation of pregnancy: Secondary | ICD-10-CM

## 2021-04-02 DIAGNOSIS — E669 Obesity, unspecified: Secondary | ICD-10-CM

## 2021-04-02 DIAGNOSIS — N871 Moderate cervical dysplasia: Secondary | ICD-10-CM

## 2021-04-02 DIAGNOSIS — O3443 Maternal care for other abnormalities of cervix, third trimester: Secondary | ICD-10-CM

## 2021-04-08 ENCOUNTER — Other Ambulatory Visit: Payer: Self-pay | Admitting: Family Medicine

## 2021-04-08 ENCOUNTER — Encounter: Payer: Self-pay | Admitting: *Deleted

## 2021-04-08 ENCOUNTER — Other Ambulatory Visit (HOSPITAL_COMMUNITY)
Admission: RE | Admit: 2021-04-08 | Discharge: 2021-04-08 | Disposition: A | Discharge status: Home / Self Care | Payer: Medicaid Other | Source: Ambulatory Visit | Attending: Family Medicine | Admitting: Family Medicine

## 2021-04-08 ENCOUNTER — Other Ambulatory Visit: Payer: Self-pay

## 2021-04-08 ENCOUNTER — Encounter: Payer: Self-pay | Admitting: Family Medicine

## 2021-04-08 ENCOUNTER — Ambulatory Visit (INDEPENDENT_AMBULATORY_CARE_PROVIDER_SITE_OTHER): Payer: Medicaid Other | Admitting: Family Medicine

## 2021-04-08 ENCOUNTER — Ambulatory Visit: Payer: Medicaid Other | Admitting: *Deleted

## 2021-04-08 ENCOUNTER — Telehealth: Payer: Self-pay | Admitting: Family Medicine

## 2021-04-08 ENCOUNTER — Ambulatory Visit: Payer: Medicaid Other | Attending: Obstetrics and Gynecology

## 2021-04-08 VITALS — BP 110/75 | HR 80 | Wt 254.3 lb

## 2021-04-08 VITALS — BP 116/76 | HR 97

## 2021-04-08 DIAGNOSIS — O0990 Supervision of high risk pregnancy, unspecified, unspecified trimester: Secondary | ICD-10-CM | POA: Insufficient documentation

## 2021-04-08 DIAGNOSIS — O365990 Maternal care for other known or suspected poor fetal growth, unspecified trimester, not applicable or unspecified: Secondary | ICD-10-CM | POA: Insufficient documentation

## 2021-04-08 NOTE — Patient Instructions (Signed)

## 2021-04-08 NOTE — Progress Notes (Signed)
   Subjective:  Natasha Conway is a 30 y.o. G2P1001 at [redacted]w[redacted]d being seen today for ongoing prenatal care.  She is currently monitored for the following issues for this low-risk pregnancy and has Post term pregnancy, 41 weeks and Supervision of high risk pregnancy, antepartum on their problem list.  Patient reports no complaints.  Contractions: Not present. Vag. Bleeding: None.  Movement: Present. Denies leaking of fluid.   The following portions of the patient's history were reviewed and updated as appropriate: allergies, current medications, past family history, past medical history, past social history, past surgical history and problem list. Problem list updated.  Objective:   Vitals:   04/08/21 1545  BP: 110/75  Pulse: 80  Weight: 254 lb 4.8 oz (115.3 kg)    Fetal Status: Fetal Heart Rate (bpm): 142   Movement: Present     General:  Alert, oriented and cooperative. Patient is in no acute distress.  Skin: Skin is warm and dry. No rash noted.   Cardiovascular: Normal heart rate noted  Respiratory: Normal respiratory effort, no problems with respiration noted  Abdomen: Soft, gravid, appropriate for gestational age. Pain/Pressure: Absent     Pelvic: Vag. Bleeding: None     Cervical exam deferred        Extremities: Normal range of motion.  Edema: None  Mental Status: Normal mood and affect. Normal behavior. Normal judgment and thought content.   Urinalysis:      Assessment and Plan:  Pregnancy: G2P1001 at [redacted]w[redacted]d  1. Supervision of high risk pregnancy, antepartum BP and FHR normal Swabs today Last growth [place] EFW 15% with IUGR resolved, following w MFM for follow up growth scan and weekly BPP per Dr. Judeth Cornfield Discussed contraception, she is considering IUD - Culture, beta strep (group b only) - GC/Chlamydia probe amp ([place])not at [facility]  Preterm labor symptoms and general obstetric precautions including but not limited to vaginal bleeding, contractions, leaking of  fluid and fetal movement were reviewed in detail with the patient. Please refer to After Visit Summary for other counseling recommendations.  Return in 1 week (on 04/15/2021) for [facility], ob visit.   Venora Maples, MD

## 2021-04-09 LAB — GC/CHLAMYDIA PROBE AMP (~~LOC~~) NOT AT ARMC
Chlamydia: NEGATIVE
Comment: NEGATIVE
Comment: NORMAL
Neisseria Gonorrhea: NEGATIVE

## 2021-04-11 LAB — CULTURE, BETA STREP (GROUP B ONLY): Strep Gp B Culture: POSITIVE — AB

## 2021-04-11 NOTE — Telephone Encounter (Signed)
Open in error

## 2021-04-14 ENCOUNTER — Encounter: Payer: Self-pay | Admitting: Family Medicine

## 2021-04-14 ENCOUNTER — Ambulatory Visit (INDEPENDENT_AMBULATORY_CARE_PROVIDER_SITE_OTHER): Payer: Medicaid Other | Admitting: Obstetrics and Gynecology

## 2021-04-14 ENCOUNTER — Other Ambulatory Visit: Payer: Self-pay

## 2021-04-14 VITALS — BP 119/77 | HR 93 | Wt 255.0 lb

## 2021-04-14 DIAGNOSIS — O0990 Supervision of high risk pregnancy, unspecified, unspecified trimester: Secondary | ICD-10-CM

## 2021-04-14 DIAGNOSIS — O99820 Streptococcus B carrier state complicating pregnancy: Secondary | ICD-10-CM

## 2021-04-14 DIAGNOSIS — Z3A37 37 weeks gestation of pregnancy: Secondary | ICD-10-CM

## 2021-04-14 NOTE — Progress Notes (Signed)
   PRENATAL VISIT NOTE  Subjective:  Natasha Conway is a 30 y.o. G2P1001 at [redacted]w[redacted]d being seen today for ongoing prenatal care.  She is currently monitored for the following issues for this high-risk pregnancy and has Post term pregnancy, 41 weeks; Supervision of high risk pregnancy, antepartum; GBS (group B Streptococcus carrier), +RV culture, currently pregnant; and [redacted] weeks gestation of pregnancy on their problem list.  Patient doing well with no acute concerns today. She reports no complaints.  Contractions: Irritability. Vag. Bleeding: None.  Movement: Present. Denies leaking of fluid.   The following portions of the patient's history were reviewed and updated as appropriate: allergies, current medications, past family history, past medical history, past social history, past surgical history and problem list. Problem list updated.  Objective:   Vitals:   04/14/21 1626  BP: 119/77  Pulse: 93  Weight: 255 lb (115.7 kg)    Fetal Status: Fetal Heart Rate (bpm): 152 Fundal Height: 37 cm Movement: Present     General:  Alert, oriented and cooperative. Patient is in no acute distress.  Skin: Skin is warm and dry. No rash noted.   Cardiovascular: Normal heart rate noted  Respiratory: Normal respiratory effort, no problems with respiration noted  Abdomen: Soft, gravid, appropriate for gestational age.  Pain/Pressure: Present     Pelvic: Cervical exam deferred        Extremities: Normal range of motion.  Edema: Trace  Mental Status:  Normal mood and affect. Normal behavior. Normal judgment and thought content.   Assessment and Plan:  Pregnancy: G2P1001 at [redacted]w[redacted]d  1. Supervision of high risk pregnancy, antepartum Continue routine care including weekly BPP until delivery  2. [redacted] weeks gestation of pregnancy   3. GBS (group B Streptococcus carrier), +RV culture, currently pregnant Treat in labor  Term labor symptoms and general obstetric precautions including but not limited to  vaginal bleeding, contractions, leaking of fluid and fetal movement were reviewed in detail with the patient.  Please refer to After Visit Summary for other counseling recommendations.   Return in about 1 week (around 04/21/2021) for [facility], in person.   Mariel Aloe, MD Faculty Attending Center for [facility]

## 2021-04-15 ENCOUNTER — Ambulatory Visit: Payer: Medicaid Other | Attending: Obstetrics and Gynecology

## 2021-04-15 ENCOUNTER — Encounter: Payer: Self-pay | Admitting: *Deleted

## 2021-04-15 ENCOUNTER — Ambulatory Visit: Payer: Medicaid Other | Admitting: *Deleted

## 2021-04-15 VITALS — BP 122/60 | HR 89

## 2021-04-15 DIAGNOSIS — O0990 Supervision of high risk pregnancy, unspecified, unspecified trimester: Secondary | ICD-10-CM | POA: Insufficient documentation

## 2021-04-15 DIAGNOSIS — O365990 Maternal care for other known or suspected poor fetal growth, unspecified trimester, not applicable or unspecified: Secondary | ICD-10-CM | POA: Diagnosis not present

## 2021-04-21 ENCOUNTER — Other Ambulatory Visit: Payer: Self-pay

## 2021-04-21 ENCOUNTER — Telehealth (HOSPITAL_COMMUNITY): Payer: Self-pay | Admitting: *Deleted

## 2021-04-21 ENCOUNTER — Encounter (HOSPITAL_COMMUNITY): Payer: Self-pay | Admitting: *Deleted

## 2021-04-21 ENCOUNTER — Ambulatory Visit (INDEPENDENT_AMBULATORY_CARE_PROVIDER_SITE_OTHER): Payer: Medicaid Other | Admitting: Family Medicine

## 2021-04-21 VITALS — BP 112/82 | HR 88 | Wt 253.3 lb

## 2021-04-21 DIAGNOSIS — O99820 Streptococcus B carrier state complicating pregnancy: Secondary | ICD-10-CM

## 2021-04-21 DIAGNOSIS — O0990 Supervision of high risk pregnancy, unspecified, unspecified trimester: Secondary | ICD-10-CM

## 2021-04-21 NOTE — Progress Notes (Signed)
   PRENATAL VISIT NOTE  Subjective:  Natasha Conway is a 30 y.o. G2P1001 at [redacted]w[redacted]d being seen today for ongoing prenatal care.  She is currently monitored for the following issues for this low-risk pregnancy and has Post term pregnancy, 41 weeks; Supervision of high risk pregnancy, antepartum; and GBS (group B Streptococcus carrier), +RV culture, currently pregnant on their problem list.  Patient reports no complaints.  Contractions: Irritability. Vag. Bleeding: None.  Movement: Present. Denies leaking of fluid.   The following portions of the patient's history were reviewed and updated as appropriate: allergies, current medications, past family history, past medical history, past social history, past surgical history and problem list.   Objective:   Vitals:   04/21/21 1000  BP: 112/82  Pulse: 88  Weight: 253 lb 4.8 oz (114.9 kg)    Fetal Status: Fetal Heart Rate (bpm): 140 Fundal Height: 38 cm Movement: Present  Presentation: Vertex  General:  Alert, oriented and cooperative. Patient is in no acute distress.  Skin: Skin is warm and dry. No rash noted.   Cardiovascular: Normal heart rate noted  Respiratory: Normal respiratory effort, no problems with respiration noted  Abdomen: Soft, gravid, appropriate for gestational age.  Pain/Pressure: Present     Pelvic: Cervical exam performed in the presence of a chaperone Dilation: 1 Effacement (%): 20 Station: Ballotable  Extremities: Normal range of motion.  Edema: Trace  Mental Status: Normal mood and affect. Normal behavior. Normal judgment and thought content.   Assessment and Plan:  Pregnancy: G2P1001 at [redacted]w[redacted]d 1. GBS (group B Streptococcus carrier), +RV culture, currently pregnant Will need treatment in labor  2. Supervision of high risk pregnancy, antepartum  3. Prior IUGR In testing Now 15%, per MFM, deliver at 39 wks IOL scheduled, orders placed.  Term labor symptoms and general obstetric precautions including but  not limited to vaginal bleeding, contractions, leaking of fluid and fetal movement were reviewed in detail with the patient. Please refer to After Visit Summary for other counseling recommendations.   Return in 1 week (on 04/28/2021).  Future Appointments  Date Time Provider Department Center  04/23/2021  2:15 PM [facility] NURSE [facility]  04/23/2021  2:30 PM [facility] US3 [facility]  04/27/2021 12:00 AM [facility] SCHED ROOM [facility] None  04/29/2021 10:55 AM Crissie Reese, Mary Sella, MD [facility]    Reva Bores, MD

## 2021-04-21 NOTE — Telephone Encounter (Signed)
Preadmission screen  

## 2021-04-21 NOTE — Patient Instructions (Signed)

## 2021-04-22 ENCOUNTER — Other Ambulatory Visit: Payer: Self-pay | Admitting: Women's Health

## 2021-04-23 ENCOUNTER — Other Ambulatory Visit: Payer: Self-pay | Admitting: Obstetrics and Gynecology

## 2021-04-23 ENCOUNTER — Ambulatory Visit: Payer: Medicaid Other | Attending: Obstetrics and Gynecology

## 2021-04-23 ENCOUNTER — Other Ambulatory Visit: Payer: Self-pay

## 2021-04-23 ENCOUNTER — Ambulatory Visit: Payer: Medicaid Other | Admitting: *Deleted

## 2021-04-23 ENCOUNTER — Encounter: Payer: Self-pay | Admitting: *Deleted

## 2021-04-23 VITALS — BP 119/71 | HR 93

## 2021-04-23 DIAGNOSIS — O98513 Other viral diseases complicating pregnancy, third trimester: Secondary | ICD-10-CM

## 2021-04-23 DIAGNOSIS — Z6841 Body Mass Index (BMI) 40.0 and over, adult: Secondary | ICD-10-CM | POA: Insufficient documentation

## 2021-04-23 DIAGNOSIS — Z3A38 38 weeks gestation of pregnancy: Secondary | ICD-10-CM

## 2021-04-23 DIAGNOSIS — O99213 Obesity complicating pregnancy, third trimester: Secondary | ICD-10-CM | POA: Diagnosis not present

## 2021-04-23 DIAGNOSIS — O365930 Maternal care for other known or suspected poor fetal growth, third trimester, not applicable or unspecified: Secondary | ICD-10-CM | POA: Diagnosis not present

## 2021-04-23 DIAGNOSIS — A749 Chlamydial infection, unspecified: Secondary | ICD-10-CM

## 2021-04-23 DIAGNOSIS — O365990 Maternal care for other known or suspected poor fetal growth, unspecified trimester, not applicable or unspecified: Secondary | ICD-10-CM

## 2021-04-23 DIAGNOSIS — O0990 Supervision of high risk pregnancy, unspecified, unspecified trimester: Secondary | ICD-10-CM | POA: Insufficient documentation

## 2021-04-23 DIAGNOSIS — O359XX0 Maternal care for (suspected) fetal abnormality and damage, unspecified, not applicable or unspecified: Secondary | ICD-10-CM | POA: Diagnosis not present

## 2021-04-23 DIAGNOSIS — O3443 Maternal care for other abnormalities of cervix, third trimester: Secondary | ICD-10-CM

## 2021-04-23 DIAGNOSIS — N870 Mild cervical dysplasia: Secondary | ICD-10-CM

## 2021-04-23 NOTE — Procedures (Signed)
Natasha Conway Mar 18, 1991 [redacted]w[redacted]d  Fetus A Non-Stress Test Interpretation for 04/23/21  Indication: Unsatisfactory BPP and obesity  Fetal Heart Rate A Mode: External Baseline Rate (A): 135 bpm Variability: Moderate Accelerations: 15 x 15 Decelerations: None Multiple birth?: No  Uterine Activity Mode: Palpation, Toco Contraction Frequency (min): None Resting Tone Palpated: Relaxed Resting Time: Adequate  Interpretation (Fetal Testing) Nonstress Test Interpretation: Reactive Comments: Dr. Judeth Cornfield reviewed tracing.

## 2021-04-24 ENCOUNTER — Encounter: Payer: Self-pay | Admitting: *Deleted

## 2021-04-25 ENCOUNTER — Other Ambulatory Visit (HOSPITAL_COMMUNITY)
Admission: RE | Admit: 2021-04-25 | Discharge: 2021-04-25 | Disposition: A | Discharge status: Home / Self Care | Payer: Medicaid Other | Source: Ambulatory Visit | Attending: Family Medicine | Admitting: Family Medicine

## 2021-04-25 DIAGNOSIS — Z01812 Encounter for preprocedural laboratory examination: Secondary | ICD-10-CM | POA: Insufficient documentation

## 2021-04-25 DIAGNOSIS — Z20822 Contact with and (suspected) exposure to covid-19: Secondary | ICD-10-CM | POA: Insufficient documentation

## 2021-04-25 LAB — SARS CORONAVIRUS 2 (TAT 6-24 HRS): SARS Coronavirus 2: NEGATIVE

## 2021-04-27 ENCOUNTER — Inpatient Hospital Stay (HOSPITAL_COMMUNITY): Payer: Medicaid Other | Admitting: Anesthesiology

## 2021-04-27 ENCOUNTER — Inpatient Hospital Stay (HOSPITAL_COMMUNITY): Payer: Medicaid Other

## 2021-04-27 ENCOUNTER — Inpatient Hospital Stay (HOSPITAL_COMMUNITY)
Admission: AD | Admit: 2021-04-27 | Discharge: 2021-04-29 | DRG: 805 | Disposition: A | Discharge status: Home / Self Care | Payer: Medicaid Other | Attending: Obstetrics and Gynecology | Admitting: Obstetrics and Gynecology

## 2021-04-27 ENCOUNTER — Other Ambulatory Visit: Payer: Self-pay

## 2021-04-27 ENCOUNTER — Encounter (HOSPITAL_COMMUNITY): Payer: Self-pay | Admitting: Family Medicine

## 2021-04-27 DIAGNOSIS — O358XX0 Maternal care for other (suspected) fetal abnormality and damage, not applicable or unspecified: Secondary | ICD-10-CM | POA: Diagnosis present

## 2021-04-27 DIAGNOSIS — O365930 Maternal care for other known or suspected poor fetal growth, third trimester, not applicable or unspecified: Principal | ICD-10-CM | POA: Diagnosis present

## 2021-04-27 DIAGNOSIS — O99824 Streptococcus B carrier state complicating childbirth: Secondary | ICD-10-CM | POA: Diagnosis present

## 2021-04-27 DIAGNOSIS — O365990 Maternal care for other known or suspected poor fetal growth, unspecified trimester, not applicable or unspecified: Secondary | ICD-10-CM | POA: Diagnosis present

## 2021-04-27 DIAGNOSIS — Z3A39 39 weeks gestation of pregnancy: Secondary | ICD-10-CM

## 2021-04-27 DIAGNOSIS — O0990 Supervision of high risk pregnancy, unspecified, unspecified trimester: Secondary | ICD-10-CM

## 2021-04-27 DIAGNOSIS — O26893 Other specified pregnancy related conditions, third trimester: Secondary | ICD-10-CM | POA: Diagnosis present

## 2021-04-27 DIAGNOSIS — O411230 Chorioamnionitis, third trimester, not applicable or unspecified: Secondary | ICD-10-CM | POA: Diagnosis not present

## 2021-04-27 DIAGNOSIS — O99214 Obesity complicating childbirth: Secondary | ICD-10-CM | POA: Diagnosis present

## 2021-04-27 LAB — CBC
HCT: 39.3 % (ref 36.0–46.0)
Hemoglobin: 13 g/dL (ref 12.0–15.0)
MCH: 30.8 pg (ref 26.0–34.0)
MCHC: 33.1 g/dL (ref 30.0–36.0)
MCV: 93.1 fL (ref 80.0–100.0)
Platelets: 244 10*3/uL (ref 150–400)
RBC: 4.22 MIL/uL (ref 3.87–5.11)
RDW: 13.2 % (ref 11.5–15.5)
WBC: 11.7 10*3/uL — ABNORMAL HIGH (ref 4.0–10.5)
nRBC: 0 % (ref 0.0–0.2)

## 2021-04-27 LAB — RPR: RPR Ser Ql: NONREACTIVE

## 2021-04-27 LAB — TYPE AND SCREEN
ABO/RH(D): O POS
Antibody Screen: NEGATIVE

## 2021-04-27 MED ORDER — ONDANSETRON HCL 4 MG/2ML IJ SOLN: 4.0000 mg | Freq: Four times a day (QID) | INTRAMUSCULAR | Status: DC | PRN

## 2021-04-27 MED ORDER — SIMETHICONE 80 MG PO CHEW: 80.0000 mg | CHEWABLE_TABLET | ORAL | Status: DC | PRN

## 2021-04-27 MED ORDER — SODIUM CHLORIDE 0.9 % IV SOLN
2.0000 g | Freq: Four times a day (QID) | INTRAVENOUS | Status: DC
  Administered 2021-04-27: 2 g via INTRAVENOUS
  Filled 2021-04-27: qty 2000

## 2021-04-27 MED ORDER — OXYTOCIN-SODIUM CHLORIDE 30-0.9 UT/500ML-% IV SOLN
1.0000 m[IU]/min | INTRAVENOUS | Status: DC
Start: 2021-04-27 — End: 2021-04-27
  Administered 2021-04-27: 4 m[IU]/min via INTRAVENOUS

## 2021-04-27 MED ORDER — FENTANYL-BUPIVACAINE-NACL 0.5-0.125-0.9 MG/250ML-% EP SOLN
12.0000 mL/h | EPIDURAL | Status: DC | PRN
Start: 2021-04-27 — End: 2021-04-27
  Administered 2021-04-27: 12 mL/h via EPIDURAL
  Filled 2021-04-27: qty 250

## 2021-04-27 MED ORDER — PHENYLEPHRINE 40 MCG/ML (10ML) SYRINGE FOR IV PUSH (FOR BLOOD PRESSURE SUPPORT): 80.0000 ug | PREFILLED_SYRINGE | INTRAVENOUS | Status: DC | PRN

## 2021-04-27 MED ORDER — SENNOSIDES-DOCUSATE SODIUM 8.6-50 MG PO TABS
2.0000 | ORAL_TABLET | Freq: Every day | ORAL | Status: DC
  Administered 2021-04-28: 2 via ORAL
  Filled 2021-04-27: qty 2

## 2021-04-27 MED ORDER — TERBUTALINE SULFATE 1 MG/ML IJ SOLN: 0.2500 mg | Freq: Once | INTRAMUSCULAR | Status: DC | PRN

## 2021-04-27 MED ORDER — OXYTOCIN-SODIUM CHLORIDE 30-0.9 UT/500ML-% IV SOLN
1.0000 m[IU]/min | INTRAVENOUS | Status: DC
Start: 2021-04-27 — End: 2021-04-27
  Administered 2021-04-27: 2 m[IU]/min via INTRAVENOUS
  Administered 2021-04-27: 1 m[IU]/min via INTRAVENOUS

## 2021-04-27 MED ORDER — COCONUT OIL OIL: 1.0000 "application " | TOPICAL_OIL | Status: DC | PRN

## 2021-04-27 MED ORDER — FENTANYL CITRATE (PF) 100 MCG/2ML IJ SOLN: 100.0000 ug | INTRAMUSCULAR | Status: DC | PRN

## 2021-04-27 MED ORDER — GENTAMICIN SULFATE 40 MG/ML IJ SOLN
5.0000 mg/kg | INTRAVENOUS | Status: DC
  Administered 2021-04-27: 380 mg via INTRAVENOUS
  Filled 2021-04-27 (×2): qty 9.5

## 2021-04-27 MED ORDER — MENTHOL 3 MG MT LOZG
1.0000 | LOZENGE | OROMUCOSAL | Status: DC | PRN
  Administered 2021-04-27 (×2): 3 mg via ORAL
  Filled 2021-04-27: qty 9

## 2021-04-27 MED ORDER — OXYCODONE-ACETAMINOPHEN 5-325 MG PO TABS: 2.0000 | ORAL_TABLET | ORAL | Status: DC | PRN

## 2021-04-27 MED ORDER — BENZONATATE 100 MG PO CAPS
200.0000 mg | ORAL_CAPSULE | Freq: Three times a day (TID) | ORAL | Status: DC | PRN
  Administered 2021-04-27: 200 mg via ORAL
  Filled 2021-04-27 (×3): qty 2

## 2021-04-27 MED ORDER — LACTATED RINGERS IV SOLN: 500.0000 mL | Freq: Once | INTRAVENOUS | Status: DC

## 2021-04-27 MED ORDER — ACETAMINOPHEN 325 MG PO TABS
650.0000 mg | ORAL_TABLET | ORAL | Status: DC | PRN
  Administered 2021-04-28: 650 mg via ORAL
  Filled 2021-04-27: qty 2

## 2021-04-27 MED ORDER — LIDOCAINE HCL (PF) 1 % IJ SOLN
INTRAMUSCULAR | Status: DC | PRN
  Administered 2021-04-27: 11 mL via EPIDURAL

## 2021-04-27 MED ORDER — ACETAMINOPHEN 500 MG PO TABS
1000.0000 mg | ORAL_TABLET | Freq: Four times a day (QID) | ORAL | Status: DC | PRN
  Administered 2021-04-27: 1000 mg via ORAL
  Filled 2021-04-27: qty 2

## 2021-04-27 MED ORDER — PRENATAL MULTIVITAMIN CH
1.0000 | ORAL_TABLET | Freq: Every day | ORAL | Status: DC
  Administered 2021-04-28: 1 via ORAL
  Filled 2021-04-27: qty 1

## 2021-04-27 MED ORDER — EPHEDRINE 5 MG/ML INJ: 10.0000 mg | INTRAVENOUS | Status: DC | PRN

## 2021-04-27 MED ORDER — BENZOCAINE-MENTHOL 20-0.5 % EX AERO: 1.0000 "application " | INHALATION_SPRAY | CUTANEOUS | Status: DC | PRN

## 2021-04-27 MED ORDER — PENICILLIN G POT IN DEXTROSE 60000 UNIT/ML IV SOLN
3.0000 10*6.[IU] | INTRAVENOUS | Status: DC
  Administered 2021-04-27 (×3): 3 10*6.[IU] via INTRAVENOUS
  Filled 2021-04-27 (×3): qty 50

## 2021-04-27 MED ORDER — WITCH HAZEL-GLYCERIN EX PADS: 1.0000 "application " | MEDICATED_PAD | CUTANEOUS | Status: DC | PRN

## 2021-04-27 MED ORDER — LACTATED RINGERS IV SOLN: INTRAVENOUS | Status: DC

## 2021-04-27 MED ORDER — DIPHENHYDRAMINE HCL 50 MG/ML IJ SOLN: 12.5000 mg | INTRAMUSCULAR | Status: DC | PRN

## 2021-04-27 MED ORDER — MISOPROSTOL 200 MCG PO TABS
800.0000 ug | ORAL_TABLET | Freq: Once | ORAL | Status: AC
  Administered 2021-04-27: 800 ug via RECTAL

## 2021-04-27 MED ORDER — ONDANSETRON HCL 4 MG/2ML IJ SOLN: 4.0000 mg | INTRAMUSCULAR | Status: DC | PRN

## 2021-04-27 MED ORDER — ONDANSETRON HCL 4 MG PO TABS: 4.0000 mg | ORAL_TABLET | ORAL | Status: DC | PRN

## 2021-04-27 MED ORDER — LACTATED RINGERS IV SOLN
500.0000 mL | INTRAVENOUS | Status: DC | PRN
  Administered 2021-04-27: 1000 mL via INTRAVENOUS
  Administered 2021-04-27: 500 mL via INTRAVENOUS

## 2021-04-27 MED ORDER — OXYTOCIN BOLUS FROM INFUSION
333.0000 mL | Freq: Once | INTRAVENOUS | Status: AC
  Administered 2021-04-27: 333 mL via INTRAVENOUS

## 2021-04-27 MED ORDER — MISOPROSTOL 200 MCG PO TABS
ORAL_TABLET | ORAL | Status: AC
  Filled 2021-04-27: qty 4

## 2021-04-27 MED ORDER — OXYTOCIN-SODIUM CHLORIDE 30-0.9 UT/500ML-% IV SOLN
2.5000 [IU]/h | INTRAVENOUS | Status: DC
  Filled 2021-04-27 (×2): qty 500

## 2021-04-27 MED ORDER — IBUPROFEN 600 MG PO TABS
600.0000 mg | ORAL_TABLET | Freq: Once | ORAL | Status: AC
  Administered 2021-04-27: 600 mg via ORAL
  Filled 2021-04-27: qty 1

## 2021-04-27 MED ORDER — IBUPROFEN 600 MG PO TABS
600.0000 mg | ORAL_TABLET | Freq: Four times a day (QID) | ORAL | Status: DC
  Administered 2021-04-28 – 2021-04-29 (×6): 600 mg via ORAL
  Filled 2021-04-27 (×6): qty 1

## 2021-04-27 MED ORDER — OXYCODONE-ACETAMINOPHEN 5-325 MG PO TABS: 1.0000 | ORAL_TABLET | ORAL | Status: DC | PRN

## 2021-04-27 MED ORDER — ACETAMINOPHEN 325 MG PO TABS: 650.0000 mg | ORAL_TABLET | ORAL | Status: DC | PRN

## 2021-04-27 MED ORDER — DIPHENHYDRAMINE HCL 25 MG PO CAPS: 25.0000 mg | ORAL_CAPSULE | Freq: Four times a day (QID) | ORAL | Status: DC | PRN

## 2021-04-27 MED ORDER — SOD CITRATE-CITRIC ACID 500-334 MG/5ML PO SOLN: 30.0000 mL | ORAL | Status: DC | PRN

## 2021-04-27 MED ORDER — MISOPROSTOL 25 MCG QUARTER TABLET: 25.0000 ug | ORAL_TABLET | ORAL | Status: DC | PRN

## 2021-04-27 MED ORDER — LIDOCAINE HCL (PF) 1 % IJ SOLN: 30.0000 mL | INTRAMUSCULAR | Status: DC | PRN

## 2021-04-27 MED ORDER — FLEET ENEMA 7-19 GM/118ML RE ENEM: 1.0000 | ENEMA | RECTAL | Status: DC | PRN

## 2021-04-27 MED ORDER — DIBUCAINE (PERIANAL) 1 % EX OINT: 1.0000 "application " | TOPICAL_OINTMENT | CUTANEOUS | Status: DC | PRN

## 2021-04-27 MED ORDER — SODIUM CHLORIDE 0.9 % IV SOLN
5.0000 10*6.[IU] | Freq: Once | INTRAVENOUS | Status: AC
  Administered 2021-04-27: 5 10*6.[IU] via INTRAVENOUS
  Filled 2021-04-27: qty 5

## 2021-04-27 MED ORDER — TETANUS-DIPHTH-ACELL PERTUSSIS 5-2.5-18.5 LF-MCG/0.5 IM SUSY: 0.5000 mL | PREFILLED_SYRINGE | Freq: Once | INTRAMUSCULAR | Status: DC

## 2021-04-27 NOTE — Discharge Summary (Signed)
Postpartum Discharge Summary    Patient Name: Ishanvi Mcquitty DOB: 08-07-1991 MRN: [id number]  Date of admission: 04/27/2021 Delivery date:04/27/2021  Delivering provider: Manya Silvas  Date of discharge: 04/29/2021  Admitting diagnosis: Maternal care for poor fetal growth [O36.5990] Intrauterine pregnancy: [redacted]w[redacted]d    Secondary diagnosis:  Active Problems:   Maternal care for poor fetal growth   Chorioamnionitis in third trimester   Vaginal delivery  Additional problems: none    Discharge diagnosis: Term Pregnancy Delivered                                          Post partum procedures: none  Augmentation: AROM, Pitocin, and OP Foley Complications: Intrauterine Inflammation or infection (Chorioamniotis)  Hospital course: Induction of Labor With Vaginal Delivery   30y.o. yo G2P2002 at 366w0das admitted to the hospital 04/27/2021 for induction of labor.  Indication for induction:  fetal growth restriction .   Patient had a labor course complicated by prolonged latent phase and triple I: Membrane Rupture Time/Date: 3:20 AM ,04/27/2021   Delivery Method:Vaginal, Spontaneous  Episiotomy: None  Lacerations:  2nd degree;Perineal  Details of delivery can be found in separate delivery note.  Patient had an uncomplicated postpartum course. Patient is discharged home 04/29/21.  Newborn Data: Birth date:04/27/2021  Birth time:6:01 PM  Gender:Female  Living status:Living  Apgars:8 ,9  Weight:2790 g   Magnesium Sulfate received: No BMZ received: No Rhophylac:N/A MMMBW:GYKZLostpartum T-DaP:Given prenatally Flu: No Transfusion:No  Physical exam  Vitals:   04/28/21 0653 04/28/21 1329 04/28/21 2335 04/29/21 0512  BP: 114/66 121/75 119/71 (!) 122/59  Pulse: 88 73 77 74  Resp: _0 Temp: 98.2 F (36.8 C) 98 F (36.7 C) 97.9 F (36.6 C) 98.1 F (36.7 C)  TempSrc:  Oral Oral Oral  SpO2: 100% 99% 99% 100%  Weight:      Height:       General: alert, cooperative,  and no distress Lochia: appropriate Uterine Fundus: firm Incision: N/A DVT Evaluation: No evidence of DVT seen on physical exam. Labs: Lab Results  Component Value Date   WBC 13.4 (H) 04/28/2021   HGB 10.4 (L) 04/28/2021   HCT 31.2 (L) 04/28/2021   MCV 92.3 04/28/2021   PLT 208 04/28/2021   No flowsheet data found. Edinburgh Score: Edinburgh Postnatal Depression Scale Screening Tool 04/28/2021  I have been able to laugh and see the funny side of things. (No Data)     After visit meds:  Allergies as of 04/29/2021   No Known Allergies      Medication List     TAKE these medications    acetaminophen 325 MG tablet Commonly known as: Tylenol Take 2 tablets (650 mg total) by mouth every 4 (four) hours as needed (for pain scale < 4).   coconut oil Oil Apply 1 application topically as needed.   ibuprofen 600 MG tablet Commonly known as: ADVIL Take 1 tablet (600 mg total) by mouth every 6 (six) hours.   prenatal multivitamin Tabs tablet Take 1 tablet by mouth at bedtime.   senna-docusate 8.6-50 MG tablet Commonly known as: Senokot-S Take 2 tablets by mouth daily.         Discharge home in stable condition Infant Feeding: Bottle and Breast Infant Disposition:home with mother Discharge instruction: per After Visit Summary and Postpartum booklet. Activity: Advance as  tolerated. Pelvic rest for 6 weeks.  Diet: routine diet Future Appointments: Future Appointments  Date Time Provider [place]  04/29/2021 10:55 AM Clarnce Flock, MD [facility]   Follow up Visit:   Please schedule this patient for a Virtual postpartum visit in 4 weeks with the following provider: Any provider. Additional Postpartum F/U: None   High risk pregnancy complicated by:  IUGR , triple I Delivery mode:  Vaginal, Spontaneous  Anticipated Birth Control:  Unsure   04/29/2021 Janet Berlin, MD

## 2021-04-27 NOTE — Anesthesia Procedure Notes (Signed)
Epidural Patient location during procedure: OB Start time: 04/27/2021 8:20 AM End time: 04/27/2021 8:40 AM  Staffing Anesthesiologist: Lowella Curb, MD Performed: anesthesiologist   Preanesthetic Checklist Completed: patient identified, IV checked, site marked, risks and benefits discussed, surgical consent, monitors and equipment checked, pre-op evaluation and timeout performed  Epidural Patient position: sitting Prep: ChloraPrep Patient monitoring: heart rate, cardiac monitor, continuous pulse ox and blood pressure Approach: midline Location: L2-L3 Injection technique: LOR saline  Needle:  Needle type: Tuohy  Needle gauge: 17 G Needle length: 9 cm Needle insertion depth: 8 cm Catheter type: closed end flexible Catheter size: 20 Guage Catheter at skin depth: 13 cm Test dose: negative  Assessment Events: blood not aspirated, injection not painful, no injection resistance, no paresthesia and negative IV test  Additional Notes Reason for block:procedure for pain

## 2021-04-27 NOTE — Progress Notes (Signed)
Labor Progress Note Fronnie Urton is a 30 y.o. G2P1001 at 79w0dpresented for IOL secondary to IUGR (now resolved).  S: Strip note.  O:  BP (!) 109/52   Pulse (!) 106   Temp 97.7 F (36.5 C) (Oral)   Resp 18   Ht '5\' 2"'  (1.575 m)   Wt 115.7 kg   LMP 07/22/2020   SpO2 98%   BMI 46.64 kg/m  EFM: baseline 135/min-moderate variability/+accels (10x10)/no decels Toco: contractions every 3 min  CVE: Dilation: 5 Effacement (%): 80 Station: -3 Presentation: Vertex Exam by:: Dr. GAstrid Drafts  A&P: 30y.o. G2P1001 345w0dresented for IOL secondary to IUGR (now resolved). #Labor: Progressing well. S/p FB. Pitocin continued since 0130. #Pain: epidural now in place #FWB: Category 2 given periods of minimal variability otherwise reassuring. Will continue to monitor. #GBS positive; continued on PCN since admission #Rubella Non-immune: plan for MMR s/p delivery  AnRanda NgoMD 8:54 AM

## 2021-04-27 NOTE — Lactation Note (Signed)
This note was copied from a baby's chart. Lactation Consultation Note  Patient Name: Natasha Conway MMNOT'[id number] Date: 04/27/2021 Reason for consult: L&D Initial assessment;Mother's request;Term Age:29 hours LC returned to assist with latching. OB staff working with Mother earlier and asked for [place] to return later.  LC attempted to latch infant, only able to lick and taste but would not latch. Grandmother/mother requested formula and wanted to feed for now and work on latching once on the floor. LC went over formula volume for infant first 24 hrs so infant still be able to latch later.    LC reviewed feeding cues hand expression and offering drops of colostrum for a feeding as an option. Mom had colostrum drops offered to infant in attempt to latch.  All questions answered at the end of the visit.  Maternal Data Has patient been taught Hand Expression?: Yes Does the patient have breastfeeding experience prior to this delivery?: No  Feeding Mother's Current Feeding Choice: Breast Milk and Formula  LATCH Score                    Lactation Tools Discussed/Used    Interventions Interventions: Breast feeding basics reviewed;Breast compression;Assisted with latch;Adjust position;Skin to skin;Support pillows;Breast massage;Position options;Hand express;Expressed milk;Education  Discharge WIC Program: Yes  Consult Status Consult Status: Follow-up Date: 04/28/21 Follow-up type: In-patient    Alfonzo Arca  Nicholson-Springer 04/27/2021, 7:53 PM

## 2021-04-27 NOTE — Progress Notes (Signed)
Natasha Conway is a 30 y.o. G2P1001 at [redacted]w[redacted]d.  Subjective: Mild pressure w/ UC's.   Objective: BP (!) 111/56   Pulse 95   Temp (!) 101 F (38.3 C) (Oral)   Resp 18   Ht 5\' 2"  (1.575 m)   Wt 115.7 kg   LMP 07/22/2020   SpO2 98%   BMI 46.64 kg/m    FHT:  FHR: 175 bpm, variability: mod,  accelerations:  10x10,  decelerations:  variables UC:   Q 2-4 minutes, 160-250 Dilation: 10 Dilation Complete Date: 04/27/21 Dilation Complete Time: 1721 Effacement (%): 70 Station: Plus 1 Presentation: Vertex Exam by:: [email or web address], CNM  Labs: NA  Assessment / Plan: [redacted]w[redacted]d week IUP Labor: Start second stage  Fetal Wellbeing:  Category I-II, overall reassuring  Pain Control:  Epidural  Anticipated MOD:  SVD Triple I: Start Amp and Gent. Tylenol for fever. Plan NICU at delivery.   [redacted]w[redacted]d, Natasha Conway, [place] 04/27/2021 5:35 PM

## 2021-04-27 NOTE — Progress Notes (Signed)
Pharmacy Antibiotic Note  Natasha Conway is a 30 y.o. female admitted on 04/27/2021 for IOL due to resolved IUGR.  Pharmacy has been consulted for Gentamicin dosing for Triple I.  Plan: Gentamicin 380mg  IV q24h ( 5mg /kg using ABW: 76.3kg). Will continue to follow  Height: 5\' 2"  (157.5 cm) Weight: 115.7 kg (255 lb) IBW/kg (Calculated) : 50.1 Dosing/ABW: 76.3kg  Temp (24hrs), Avg:99 F (37.2 C), Min:97.7 F (36.5 C), Max:101 F (38.3 C)  Recent Labs  Lab 04/27/21 0039  WBC 11.7*    CrCl cannot be calculated (No successful lab value found.).    No Known Allergies  Antimicrobials this admission: Penicillin protocol for GBS 7/3 Ampicillin 2 gram IV q6h  7/3 >>   Thank you for allowing pharmacy to be a part of this patient's care.  04/27/2021 5:37 PM

## 2021-04-27 NOTE — Progress Notes (Signed)
Natasha Conway is a 30 y.o. G2P1001 at [redacted]w[redacted]d.  Subjective: Comfortable w/ epidural.   Objective: BP 116/71   Pulse 92   Temp 97.7 F (36.5 C) (Oral)   Resp 18   Ht 5\' 2"  (1.575 m)   Wt 115.7 kg   LMP 07/22/2020   SpO2 98%   BMI 46.64 kg/m    FHT:  FHR: 140 bpm, variability: mod,  accelerations:  15x15,  decelerations:  none UC:   Q 3-5 minutes, mod-strong Dilation: 5 Effacement (%): 80 Station: -3 Presentation: Vertex Exam by:: Byrne Capek, CNM AROM small amount of clear fluid. May have had small leak already. IUPC placed w/out difficulty.  Labs: Results for orders placed or performed during the hospital encounter of 04/27/21 (from the past 24 hour(s))  CBC     Status: Abnormal   Collection Time: 04/27/21 12:39 AM  Result Value Ref Range   WBC 11.7 (H) 4.0 - 10.5 K/uL   RBC 4.22 3.87 - 5.11 MIL/uL   Hemoglobin 13.0 12.0 - 15.0 g/dL   HCT 06/28/21 41.3 - 24.4 %   MCV 93.1 80.0 - 100.0 fL   MCH 30.8 26.0 - 34.0 pg   MCHC 33.1 30.0 - 36.0 g/dL   RDW 01.0 27.2 - 53.6 %   Platelets 244 150 - 400 K/uL   nRBC 0.0 0.0 - 0.2 %  Type and screen     Status: None   Collection Time: 04/27/21  4:27 AM  Result Value Ref Range   ABO/RH(D) O POS    Antibody Screen NEG    Sample Expiration      04/30/2021,2359 Performed at [facility], [address] [place], [place]     Assessment / Plan: [redacted]w[redacted]d week IUP Labor: Protracted latent phase. AROM performed and IUPC now in place. Will titrate pitocin to achieve adequate MVU's. Fetal Wellbeing:  Category I Pain Control:  Epidural Anticipated MOD:  SVD  [redacted]w[redacted]d [name], CNM 04/27/2021 10:03 AM

## 2021-04-27 NOTE — Lactation Note (Addendum)
This note was copied from a baby's chart. Lactation Consultation Note  Patient Name: Natasha Conway Date: 04/27/2021 Reason for consult: Initial assessment;Mother's request;Difficult latch;1st time breastfeeding;Term Age:29 hrs  Infant cueing on arrival and RN, Malachi Bonds working with Mom latched in football hold. Infant pops and off breast, best latch tea cup hold in cross cradle with breast compression. LC offered infant 3 ml of EBM via spoon then assisted latching for 10 min.   LC reviewed how to reduce calorie loss for LPTI given her birthweight keeping her STS, hat on all times feeding 8-12x in 24 hr period no more than 3 hrs without an attempt. Mom to pace bottle feed after each latch offering eBM first followed by formula with extra slow flow nipple. LPTI supplementation volume guide provided and reviewed.  Pumping DEBP q 3 hrs for 15 min I and O sheet reviewed LC brochure of inpatient and outpatient services reviewed.  All questions answered at the end of the visit.   LC left with Dad offering 5 ml of EBM via pace bottle on return parents stated infant fell asleep and did not take it.   Maternal Data Has patient been taught Hand Expression?: Yes Does the patient have breastfeeding experience prior to this delivery?: No  Feeding Mother's Current Feeding Choice: Breast Milk and Formula  LATCH Score Latch: Repeated attempts needed to sustain latch, nipple held in mouth throughout feeding, stimulation needed to elicit sucking reflex.  Audible Swallowing: A few with stimulation  Type of Nipple: Everted at rest and after stimulation  Comfort (Breast/Nipple): Soft / non-tender  Hold (Positioning): Assistance needed to correctly position infant at breast and maintain latch.  LATCH Score: 7   Lactation Tools Discussed/Used Tools: Pump;Flanges Flange Size: 24 Breast pump type: Double-Electric Breast Pump Pump Education: Setup, frequency, and cleaning;Milk  Storage Reason for Pumping: increase stimulation Pumping frequency: every 3 hrs for 15 min  Interventions Interventions: Breast feeding basics reviewed;Breast compression;Assisted with latch;Adjust position;Skin to skin;Support pillows;Breast massage;Position options;Hand express;Expressed milk;Education;DEBP  Discharge WIC Program: Yes  Consult Status Consult Status: Follow-up Date: 04/28/21 Follow-up type: In-patient    Natasha Shuler  [place] 04/27/2021, 11:34 PM

## 2021-04-27 NOTE — Lactation Note (Signed)
This note was copied from a baby's chart. Lactation Consultation Note  Patient Name: Natasha Conway WPVXY'I Date: 04/27/2021 Age:29 hours  Attempted LC visit after delivery. RN encouraged LC to come back at another time.     Robecca Fulgham A Higuera Ancidey 04/27/2021, 6:35 PM

## 2021-04-27 NOTE — Progress Notes (Signed)
Klaire Audrielle Vankuren is a 30 y.o. G2P1001 at [redacted]w[redacted]d.  Subjective: Comfortable w/ epidural.   Objective: BP 121/60   Pulse 85   Temp (P) 99.7 F (37.6 C) (Oral)   Resp 18   Ht 5\' 2"  (1.575 m)   Wt 115.7 kg   LMP 07/22/2020   SpO2 98%   BMI 46.64 kg/m    FHT:  FHR: 145 bpm, variability: mod,  accelerations:  10x10,  decelerations:  earlies UC:   Q 1-4 minutes, MVU's 140-220 Dilation: 5 Effacement (%): 70 Station: -3 Presentation: Vertex Exam by:: J.Cox, RN  Labs: NA  Assessment / Plan: [redacted]w[redacted]d week IUP Labor: Protracted latent labor. MVU's adequate x 4 hours. Pitocin at 24 mU. At next check will consider pitocin break if no change.  Fetal Wellbeing:  Category I Pain Control:  Epidural Anticipated MOD:  SVD  [redacted]w[redacted]d [name], CNM 04/27/2021 2:22 PM

## 2021-04-27 NOTE — H&P (Signed)
OBSTETRIC ADMISSION HISTORY AND PHYSICAL  Natasha Conway is a 30 y.o. female G2P1001 with IUP at 23w0dby L/19 presenting for IOL secondary to IUGR (now resolved). She reports +FMs, No LOF, no VB, no blurry vision, headaches or peripheral edema, and RUQ pain.  She plans on breast and formula feeding. She is undecided for birth control. [facility]  Estimated Date of Delivery: 05/04/21  Sono:  _0 , CWD, normal anatomy, cephalic presentation, 27026V 20% EFW   Prenatal History/Complications:  - IUGR (diagnosed at 350w3dased on EFW 9%, now resolved) - Ventricular asymmetry (right > left), follow-up Fetal Echo wnl but limited by maternal habitus - Obesity (BMI 46 on admission) - Rubella non-immune  Past Medical History: Past Medical History:  Diagnosis Date   Chlamydia    Medical history non-contributory     Past Surgical History: Past Surgical History:  Procedure Laterality Date   NO PAST SURGERIES     WISDOM TOOTH EXTRACTION      Obstetrical History: OB History     Gravida  2   Para  1   Term  1   Preterm  0   AB  0   Living  1      SAB  0   IAB  0   Ectopic  0   Multiple  0   Live Births  1           Social History Social History   Socioeconomic History   Marital status: Single    Spouse name: Not on file   Number of children: Not on file   Years of education: Not on file   Highest education level: Not on file  Occupational History   Not on file  Tobacco Use   Smoking status: Never   Smokeless tobacco: Never  Vaping Use   Vaping Use: Never used  Substance and Sexual Activity   Alcohol use: No   Drug use: No   Sexual activity: Not Currently    Birth control/protection: None  Other Topics Concern   Not on file  Social History Narrative   Not on file   Social Determinants of Health   Financial Resource Strain: Not on file  Food Insecurity: No Food Insecurity   Worried  About Running Out of Food in the Last Year: Never true   Ra[place]artlettn the Last Year: Never true  Transportation Needs: No Transportation Needs   Lack of Transportation (Medical): No   Lack of Transportation (Non-Medical): No  Physical Activity: Not on file  Stress: Not on file  Social Connections: Not on file    Family History: Family History  Problem Relation Age of Onset   Cancer Paternal Grandmother    Cancer Paternal Grandfather    Hypertension Mother    Hypertension Father     Allergies: No Known Allergies  Medications Prior to Admission  Medication Sig Dispense Refill Last Dose   Prenatal Vit-Fe Fumarate-FA (PRENATAL MULTIVITAMIN) TABS tablet Take 1 tablet by mouth at bedtime.        Review of Systems   All systems reviewed and negative except as stated in HPI  Blood pressure (!) 120/59, pulse 82, temperature 98.3 F (36.8 C), temperature source Oral, resp. rate 16, last menstrual period 07/22/2020, unknown if currently breastfeeding. General appearance: alert, cooperative, and appears stated age Lungs: normal WOB Heart: regular rate  Abdomen: soft, non-tender Extremities: no  sign of DVT Presentation: cephalic on bedside ultrasound Fetal monitoringBaseline: 130 bpm, Variability: minimal-moderate, Accelerations: Reactive, and Decelerations: Absent Uterine activityNone   Prenatal labs: ABO, Rh: O/Positive/-- (01/13 0000) Antibody: Negative (01/13 0000) Rubella:  non-immune RPR: Nonreactive (04/18 0000)  HBsAg:   negative HIV: Non-reactive (04/18 0000)  GBS: Positive/-- (06/14 1632)  1 hr Glucola wnl Genetic screening  not performed Anatomy US wnl but limited  Prenatal Transfer Tool  Maternal Diabetes: No Genetic Screening: not performed Maternal Ultrasounds/Referrals: IUGR and Other:possible cardiac anomaly (RV >LV) Fetal Ultrasounds or other Referrals:  Fetal echo, Referred to Materal Fetal Medicine  Maternal Substance Abuse:  No Significant  Maternal Medications:  None Significant Maternal Lab Results: Group B Strep positive  No results found for this or any previous visit (from the past 24 hour(s)).  Patient Active Problem List   Diagnosis Date Noted   Maternal care for poor fetal growth 04/27/2021   GBS (group B Streptococcus carrier), +RV culture, currently pregnant 04/14/2021   Supervision of high risk pregnancy, antepartum 03/25/2021   Post term pregnancy, 41 weeks 06/11/2016    Assessment/Plan:  Natasha Conway is a 30 y.o. G2P1001 at 65w0dhere for IOL for IUGR (now resolved).  #Labor: Given periods of minimal variability and borderline IUGR, FB placed on admission. Will start pitocin and plan to up-titrate as clinically indicated s/p expulsion of FB. #Pain: TBD per pt preference (considering epidural at this time) #FWB: Now reactive but Category 2 at times given periods of minimal variability #ID: GBS+ >PCN started on admission #MOF: breast & formula #MOC: undecided #Circ: n/a #Rubella non-immune: plan for MMR vaccine s/p delivery  ARanda Ngo MD  04/27/2021, 12:48 AM

## 2021-04-27 NOTE — Anesthesia Preprocedure Evaluation (Signed)
Anesthesia Evaluation  Patient identified by MRN, date of birth, ID band Patient awake    Reviewed: Allergy & Precautions, Patient's Chart, lab work & pertinent test results  Airway Mallampati: III       Dental   Pulmonary neg pulmonary ROS,    Pulmonary exam normal        Cardiovascular negative cardio ROS Normal cardiovascular exam     Neuro/Psych negative neurological ROS     GI/Hepatic negative GI ROS, Neg liver ROS,   Endo/Other  Morbid obesity  Renal/GU negative Renal ROS     Musculoskeletal   Abdominal (+) + obese,   Peds  Hematology negative hematology ROS (+)   Anesthesia Other Findings   Reproductive/Obstetrics (+) Pregnancy                             Lab Results  Component Value Date   WBC 11.7 (H) 04/27/2021   HGB 13.0 04/27/2021   HCT 39.3 04/27/2021   MCV 93.1 04/27/2021   PLT 244 04/27/2021    Anesthesia Physical  Anesthesia Plan  ASA: III  Anesthesia Plan: Epidural   Post-op Pain Management:    Induction:   PONV Risk Score and Plan:   Airway Management Planned: Natural Airway  Additional Equipment:   Intra-op Plan:   Post-operative Plan:   Informed Consent: I have reviewed the patients History and Physical, chart, labs and discussed the procedure including the risks, benefits and alternatives for the proposed anesthesia with the patient or authorized representative who has indicated his/her understanding and acceptance.       Plan Discussed with:   Anesthesia Plan Comments:         Anesthesia Quick Evaluation

## 2021-04-28 DIAGNOSIS — O99824 Streptococcus B carrier state complicating childbirth: Secondary | ICD-10-CM

## 2021-04-28 DIAGNOSIS — Z3A39 39 weeks gestation of pregnancy: Secondary | ICD-10-CM

## 2021-04-28 DIAGNOSIS — O411230 Chorioamnionitis, third trimester, not applicable or unspecified: Secondary | ICD-10-CM

## 2021-04-28 DIAGNOSIS — O365930 Maternal care for other known or suspected poor fetal growth, third trimester, not applicable or unspecified: Secondary | ICD-10-CM

## 2021-04-28 LAB — CBC
HCT: 31.2 % — ABNORMAL LOW (ref 36.0–46.0)
Hemoglobin: 10.4 g/dL — ABNORMAL LOW (ref 12.0–15.0)
MCH: 30.8 pg (ref 26.0–34.0)
MCHC: 33.3 g/dL (ref 30.0–36.0)
MCV: 92.3 fL (ref 80.0–100.0)
Platelets: 208 10*3/uL (ref 150–400)
RBC: 3.38 MIL/uL — ABNORMAL LOW (ref 3.87–5.11)
RDW: 13.1 % (ref 11.5–15.5)
WBC: 13.4 10*3/uL — ABNORMAL HIGH (ref 4.0–10.5)
nRBC: 0 % (ref 0.0–0.2)

## 2021-04-28 NOTE — Progress Notes (Addendum)
POSTPARTUM PROGRESS NOTE  Subjective: Natasha Conway is a 30 y.o. [id number] s/p SVD at [redacted]w[redacted]d.  She reports she doing well. No acute events overnight. She denies any problems with ambulating, voiding or po intake. Denies nausea or vomiting. She has passed flatus. Pain is moderately controlled.  Lochia is mild.  Objective: Blood pressure 98/68, pulse 90, temperature 97.8 F (36.6 C), temperature source Oral, resp. rate 18, height 5\' 2"  (1.575 m), weight 115.7 kg, last menstrual period 07/22/2020, SpO2 100 %, unknown if currently breastfeeding.  Physical Exam:  General: alert, cooperative and no distress Chest: no respiratory distress Abdomen: soft, appropriately-tender  Uterine Fundus: firm and at level of umbilicus Extremities: No calf swelling or tenderness  no edema  Recent Labs    04/27/21 0039 04/28/21 0419  HGB 13.0 10.4*  HCT 39.3 31.2*    Assessment/Plan: Natasha Conway is a 30 y.o. 26 s/p SVD at [redacted]w[redacted]d.  Routine Postpartum Care: Doing well, pain well-controlled.  -- Continue routine care, lactation support  -- Contraception: discussed at bedside, thinking about nexplanon or IUD as an outpatient.  -- Feeding: breast and formula   -- Fever - at time of delivery. Has been afebrile since. Antibiotics deferred. Continue to monitor.   Dispo: Plan for discharge PPD#2.  [redacted]w[redacted]d, MD OB Fellow, Faculty [facility] 04/28/2021 6:38 AM

## 2021-04-28 NOTE — Lactation Note (Signed)
This note was copied from a baby's chart. Lactation Consultation Note  Patient Name: Natasha Conway [id number] Date: 04/28/2021 Reason for consult: Follow-up assessment;Term;1st time breastfeeding Age:29 hours   P2 mother whose infant is now 37 hours old.  This is a term baby at 39+0 weeks.  Mother did not breast feed her first child.  Mother's feeding preference is breast/formula.  Baby was asleep in the bassinet when I arrived.  Mother had no questions/concerns related to breast feeding at this time.  Encouraged hand expression before/after feedings to help ensure a good milk supply.  Suggested mother call her RN/LC for latch assistance as needed.  Education completed on nipple care including using EBM and coconut oil for comfort.  Provided coconut oil with instructions for use.  Mother will continue to feed on cue, offering the breast prior to supplementation.  Volume guidelines provided and mother has no difficulty with bottle feeding.  Baby has received Similac 20 calorie formula and also Similac 22 calorie formula; will check with RN on preference for this baby.    Mother does not have a DEBP for home use.  She is a [facility] participant in [place] but is planning on accepting the [employer].  Mother will call the [facility] office tomorrow.  Father and grandmother present.   Maternal Data Has patient been taught Hand Expression?: Yes Does the patient have breastfeeding experience prior to this delivery?: No  Feeding Mother's Current Feeding Choice: Breast Milk and Formula Nipple Type: Extra Slow Flow  LATCH Score                    Lactation Tools Discussed/Used Tools: Pump;Flanges;Coconut oil Flange Size: 24 Breast pump type: Double-Electric Breast Pump;Manual Pump Education: Setup, frequency, and cleaning (No review needed) Reason for Pumping: Breast stimulation for supplementation Pumping frequency: After every feeding  Interventions     Discharge Pump: DEBP;Manual WIC Program: Yes  Consult Status Consult Status: Follow-up Date: 04/29/21 Follow-up type: In-patient    Natasha Conway R Trygg Mantz 04/28/2021, 4:07 PM

## 2021-04-28 NOTE — Anesthesia Postprocedure Evaluation (Signed)
Anesthesia Post Note  Patient: Natasha Conway  Procedure(s) Performed: AN AD HOC LABOR EPIDURAL     Patient location during evaluation: Mother Baby Anesthesia Type: Epidural Level of consciousness: awake, oriented and awake and alert Pain management: pain level controlled Vital Signs Assessment: post-procedure vital signs reviewed and stable Respiratory status: spontaneous breathing, respiratory function stable and nonlabored ventilation Cardiovascular status: stable Postop Assessment: no headache, adequate PO intake, able to ambulate, patient able to bend at knees and no apparent nausea or vomiting Anesthetic complications: no   No notable events documented.  Last Vitals:  Vitals:   04/28/21 0324 04/28/21 0653  BP: 98/68 114/66  Pulse: 90 88  Resp: 18 18  Temp: 36.6 C 36.8 C  SpO2:  100%    Last Pain:  Vitals:   04/28/21 0705  TempSrc:   PainSc: 0-No pain   Pain Goal:                   [name]

## 2021-04-29 ENCOUNTER — Encounter: Payer: Medicaid Other | Admitting: Family Medicine

## 2021-04-29 MED ORDER — MEASLES, MUMPS & RUBELLA VAC IJ SOLR: 0.5000 mL | Freq: Once | INTRAMUSCULAR | Status: DC

## 2021-04-29 MED ORDER — COCONUT OIL OIL: 1.0000 "application " | TOPICAL_OIL | 0 refills | Status: AC | PRN

## 2021-04-29 MED ORDER — ACETAMINOPHEN 325 MG PO TABS: 650.0000 mg | ORAL_TABLET | ORAL | Status: AC | PRN

## 2021-04-29 MED ORDER — SENNOSIDES-DOCUSATE SODIUM 8.6-50 MG PO TABS: 2.0000 | ORAL_TABLET | Freq: Every day | ORAL | Status: AC

## 2021-04-29 MED ORDER — IBUPROFEN 600 MG PO TABS: 600.0000 mg | ORAL_TABLET | Freq: Four times a day (QID) | ORAL | 0 refills | Status: AC

## 2021-04-30 ENCOUNTER — Ambulatory Visit: Payer: Self-pay

## 2021-04-30 LAB — SURGICAL PATHOLOGY

## 2021-04-30 NOTE — Lactation Note (Signed)
This note was copied from a baby's chart. Lactation Consultation Note Mother plans to bottle feed only. She indicates she may pump but has not pumped today. Previously provided ed on pumping was reviewed. Will plan f/u prn only.   Patient Name: Girl Lucetta Baehr EQAST'M Date: 04/30/2021 Reason for consult: Follow-up assessment Age:29 hours   Feeding Mother's Current Feeding Choice: Formula Nipple Type: Dr. Levert Feinstein Preemie   Lactation Tools Discussed/Used Pumping frequency: 0 x today  Interventions Interventions: Education  Consult Status Consult Status: Follow-up Follow-up type: In-patient   Elder Negus, MA IBCLC 04/30/2021, 2:13 PM

## 2021-05-01 ENCOUNTER — Ambulatory Visit: Payer: Self-pay

## 2021-05-01 NOTE — Lactation Note (Signed)
This note was copied from a baby's chart. Lactation Consultation Note  Patient Name: Natasha Conway [id number]'W Date: 05/01/2021 Reason for consult: Follow-up assessment;Term;Mother's request Age:29 days  Visited with mom of 95 hours old FT female, she's a P2. Mom and baby are going home today, baby was in the NICU but mom hasn't been pumping. However, baby's RN called this LC because mom wanted to take a hand pump home. Issued hand pump and encouraged mom to use it. Reviewed discharge education, prevention/treatment of sore nipples & engorgement and BF resources.   FOB present and supportive, family is bilingual; this consult took place in mother's preferred language, Spanish. Parents reported all questions and concerns were answered, they're both aware of LC OP services and will call PRN.  Maternal Data    Feeding Mother's Current Feeding Choice: Formula Nipple Type: Dr. Levert Feinstein Preemie  LATCH Score                    Lactation Tools Discussed/Used Tools: Pump;Flanges Flange Size: 24 Breast pump type: Manual;Double-Electric Breast Pump Pump Education: Setup, frequency, and cleaning;Milk Storage Reason for Pumping: mom requested a hand pump to take home Pumping frequency: 0 Pumped volume: 0 mL (she hasn't pumped today with the DEBP set up in the room)  Interventions Interventions: Breast feeding basics reviewed;Hand pump;DEBP  Discharge Discharge Education: Engorgement and breast care;Outpatient recommendation Pump: DEBP;Manual  Consult Status Consult Status: Complete Date: 05/01/21 Follow-up type: Call as needed    Craigory Toste Venetia Constable 05/01/2021, 5:13 PM

## 2021-05-06 ENCOUNTER — Telehealth: Payer: Self-pay | Admitting: General Practice

## 2021-05-06 NOTE — Telephone Encounter (Signed)
Jenn called and left message regarding patient's short term disability claim- unclear what company she was calling from, sounded like [employer]. She left a message requesting patient's delivery type & date & provided call back number [phone].

## 2021-05-07 ENCOUNTER — Telehealth (HOSPITAL_COMMUNITY): Payer: Self-pay | Admitting: *Deleted

## 2021-05-07 NOTE — Telephone Encounter (Signed)
Left message to return nurse call. Duffy Rhody, RN 05/07/2021 at 7:05pm

## 2021-06-09 ENCOUNTER — Other Ambulatory Visit: Payer: Self-pay

## 2021-06-09 ENCOUNTER — Ambulatory Visit (INDEPENDENT_AMBULATORY_CARE_PROVIDER_SITE_OTHER): Payer: Medicaid Other | Admitting: Medical

## 2021-06-09 VITALS — BP 124/83 | HR 66 | Wt 240.0 lb

## 2021-06-09 DIAGNOSIS — Z87898 Personal history of other specified conditions: Secondary | ICD-10-CM | POA: Diagnosis not present

## 2021-06-09 DIAGNOSIS — Z3202 Encounter for pregnancy test, result negative: Secondary | ICD-10-CM

## 2021-06-09 DIAGNOSIS — Z8759 Personal history of other complications of pregnancy, childbirth and the puerperium: Secondary | ICD-10-CM

## 2021-06-09 DIAGNOSIS — Z30017 Encounter for initial prescription of implantable subdermal contraceptive: Secondary | ICD-10-CM

## 2021-06-09 LAB — POCT PREGNANCY, URINE: Preg Test, Ur: NEGATIVE

## 2021-06-09 MED ORDER — ETONOGESTREL 68 MG ~~LOC~~ IMPL
68.0000 mg | DRUG_IMPLANT | Freq: Once | SUBCUTANEOUS | Status: AC
  Administered 2021-06-09: 68 mg via SUBCUTANEOUS

## 2021-06-09 NOTE — Progress Notes (Signed)
Post Partum Visit Note  Natasha Conway is a 30 y.o. G57P2002 female who presents for a postpartum visit. She is 6 weeks postpartum following a normal spontaneous vaginal delivery.  I have fully reviewed the prenatal and intrapartum course. The delivery was at [redacted]w[redacted]d gestational weeks.  Anesthesia: epidural. Postpartum course has been complicated by suspected triple I infection. Baby is doing well. Baby is feeding by bottle - Similac Advance. Bleeding thin lochia. Bowel function is normal. Bladder function is normal. Patient is not sexually active. Contraception method is abstinence. Postpartum depression screening: negative.   Upstream - 06/09/21 1342       Pregnancy Intention Screening   Does the patient want to become pregnant in the next year? No    Does the patient's partner want to become pregnant in the next year? No    Would the patient like to discuss contraceptive options today? Yes      Contraception Wrap Up   Current Method Abstinence    End Method Hormonal Implant    Contraception Counseling Provided Yes            The pregnancy intention screening data noted above was reviewed. Potential methods of contraception were discussed. The patient elected to proceed with Hormonal Implant.   Edinburgh Postnatal Depression Scale - 06/09/21 1323       Edinburgh Postnatal Depression Scale:  In the Past 7 Days   I have been able to laugh and see the funny side of things. 0    I have looked forward with enjoyment to things. 0    I have blamed myself unnecessarily when things went wrong. 0    I have been anxious or worried for no good reason. 0    I have felt scared or panicky for no good reason. 0    Things have been getting on top of me. 0    I have been so unhappy that I have had difficulty sleeping. 0    I have felt sad or miserable. 1    I have been so unhappy that I have been crying. 0    The thought of harming myself has occurred to me. 0    Edinburgh Postnatal  Depression Scale Total 1             Health Maintenance Due  Topic Date Due   PAP-Cervical Cytology Screening  Never done   PAP SMEAR-Modifier  Never done   COVID-19 Vaccine (3 - Booster for Pfizer series) 12/13/2020   INFLUENZA VACCINE  05/26/2021    The following portions of the patient's history were reviewed and updated as appropriate: allergies, current medications, past family history, past medical history, past social history, past surgical history, and problem list.  Review of Systems Pertinent items are noted in HPI.  Objective:  BP 124/83   Pulse 66   Wt 240 lb (108.9 kg)   LMP 07/18/2020   Breastfeeding No   BMI 43.90 kg/m    General:  alert and cooperative   Breasts:  not indicated  Lungs: clear to auscultation bilaterally  Heart:  regular rate and rhythm, S1, S2 normal, no murmur, click, rub or gallop  Abdomen: soft, non-tender; bowel sounds normal; no masses,  no organomegaly   Wound N/A  GU exam:  normal        GYNECOLOGY CLINIC PROCEDURE NOTE  Nexplanon Insertion Procedure Patient was given informed consent, she signed consent form.  Patient does understand that irregular bleeding is a  very common side effect of this medication. She was advised to have backup contraception for one week after placement. Pregnancy test in clinic today was negative.  Appropriate time out taken.  Patient's left arm was prepped in the usual sterile fashion.. The insertion area was marked.  Patient was prepped with alcohol swab and then injected with 3 ml of 1% lidocaine.  She was prepped with betadine, Nexplanon removed from packaging,  Device confirmed in needle, then inserted full length of needle and withdrawn per handbook instructions. Nexplanon was able to palpated in the patient's arm; patient palpated the insert herself. There was minimal blood loss.  Patient insertion site covered with guaze and a pressure bandage to reduce any bruising.  The patient tolerated the  procedure well and was given post procedure instructions.   Assessment:   Normal postpartum exam.  Nexplanon insertion  History of Triple I History of fetal growth restriction  Plan:   Essential components of care per ACOG recommendations:  1.  Mood and well being: Patient with negative depression screening today. Reviewed local resources for support.  - Patient tobacco use? No.   - hx of drug use? No.    2. Infant care and feeding:  -Patient currently breastmilk feeding? No.  -Social determinants of health (SDOH) reviewed in EPIC. No concerns  3. Sexuality, contraception and birth spacing - Patient does not want a pregnancy in the next year.  Desired family size is 3 children.  - Reviewed forms of contraception in tiered fashion. Patient desired  Nexplanon  today.   - Discussed birth spacing of 18 months  4. Sleep and fatigue -Encouraged family/partner/community support of 4 hrs of uninterrupted sleep to help with mood and fatigue  5. Physical Recovery  - Discussed patients delivery and complications. She describes her labor as good. - Patient had a Vaginal problems after delivery including triple I . Patient had a 2nd degree laceration. Perineal healing reviewed. Patient expressed understanding - Patient has urinary incontinence? No. - Patient is safe to resume physical and sexual activity  6.  Health Maintenance - HM due items addressed Yes - Last pap smear No results found for: DIAGPAP Pap smear not done at today's visit. ROI requested for pap smear from last year at [facility] -Breast Cancer screening indicated? No.   7. Chronic Disease/Pregnancy Condition follow up: None   Vonzella Nipple, PA-C [facility] [employer], [facility]

## 2023-03-12 IMAGING — US US MFM OB FOLLOW-UP
1 series · 14 of 28 positions shown · non-contrast
Comparison: none

[Series 1: us mfm ob follow-up · 36 acquisitions, 14 frames shown]
[im 2/36]
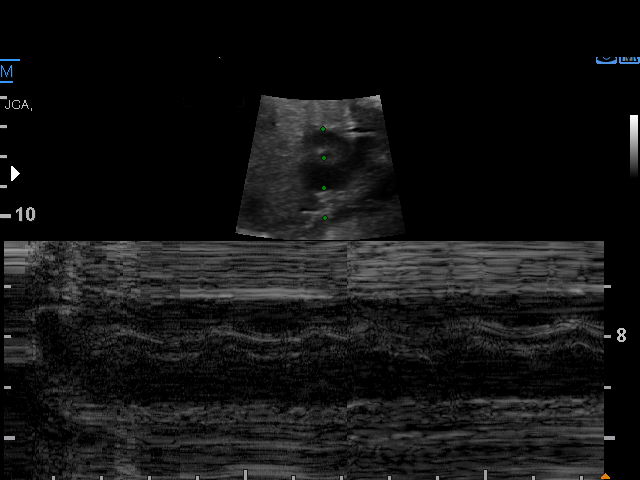
[im 4/36]
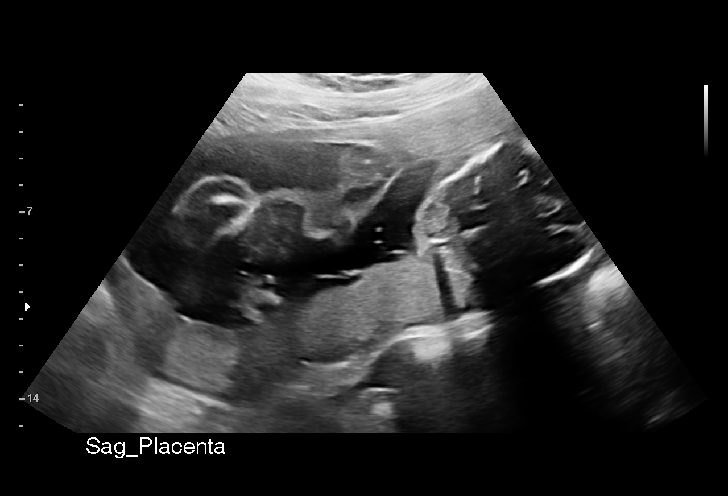
[im 7/36]
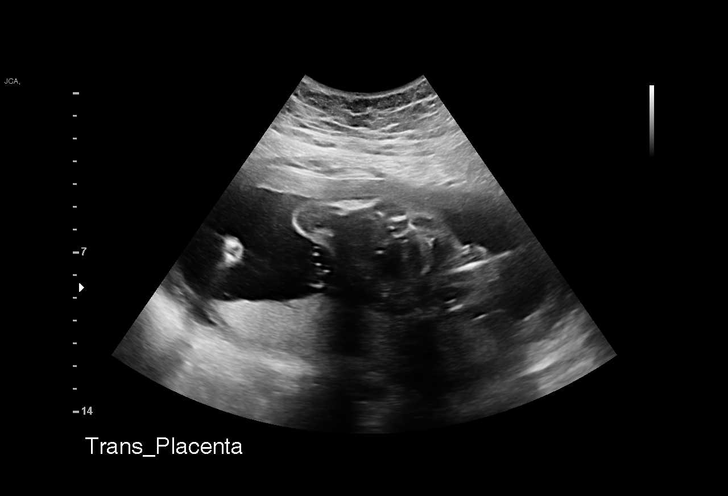
[im 10/36]
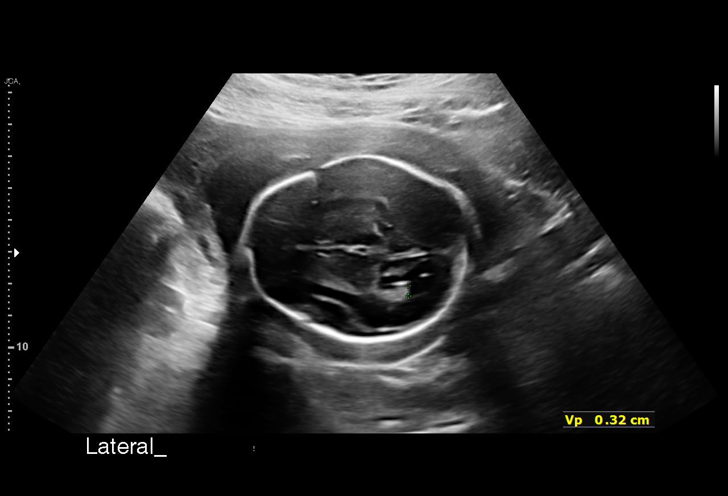
[im 12/36]
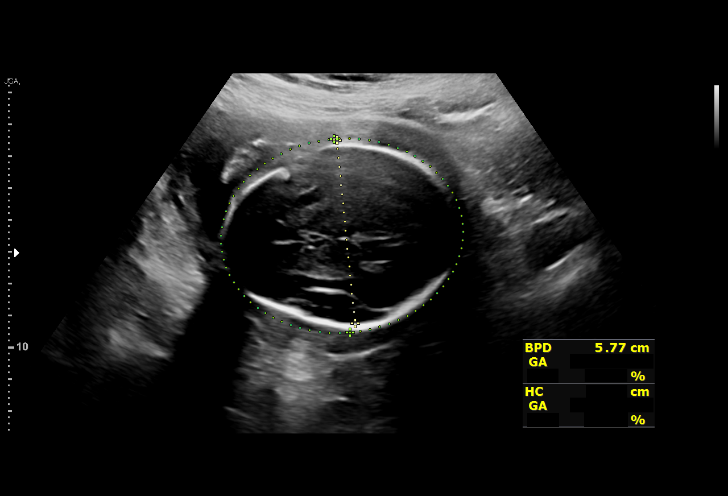
[im 15/36]
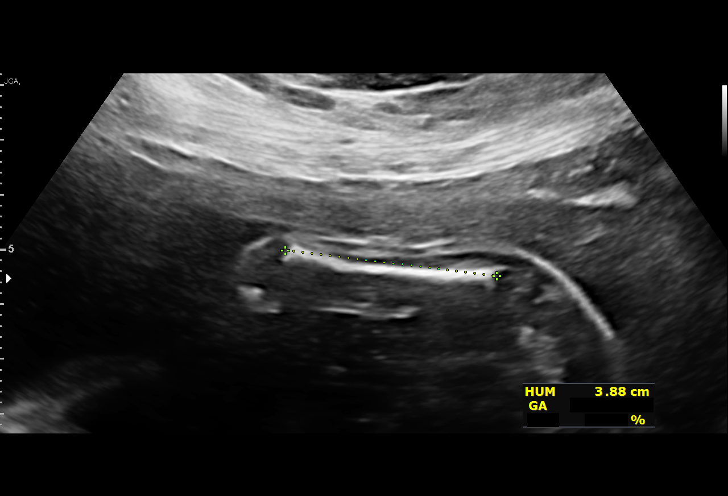
[im 17/36]
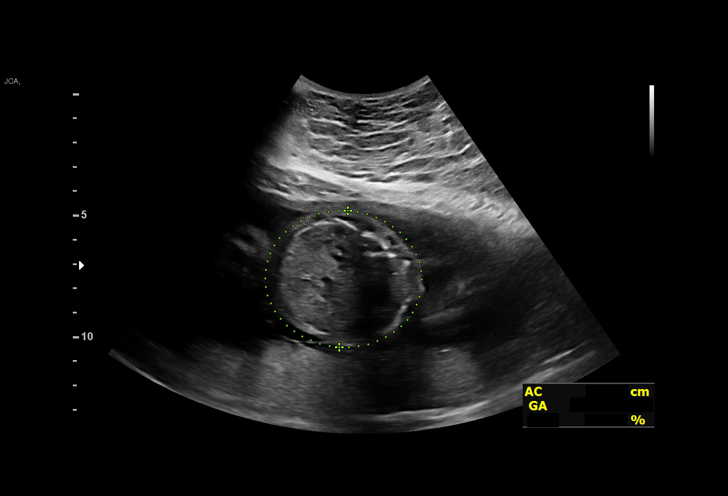
[im 20/36]
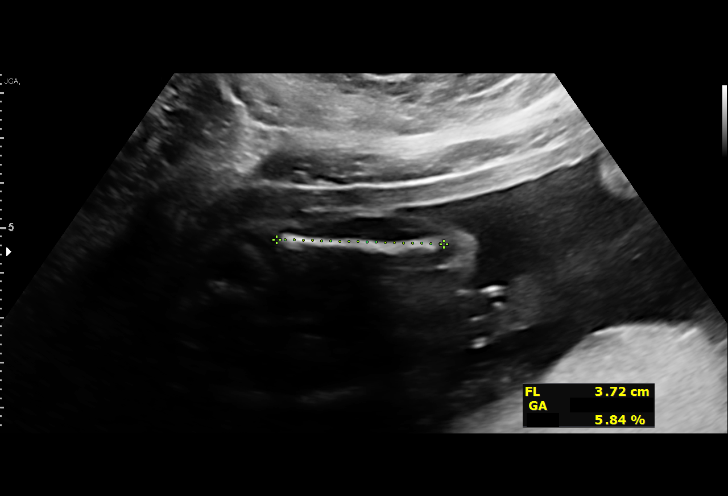
[im 23/36]
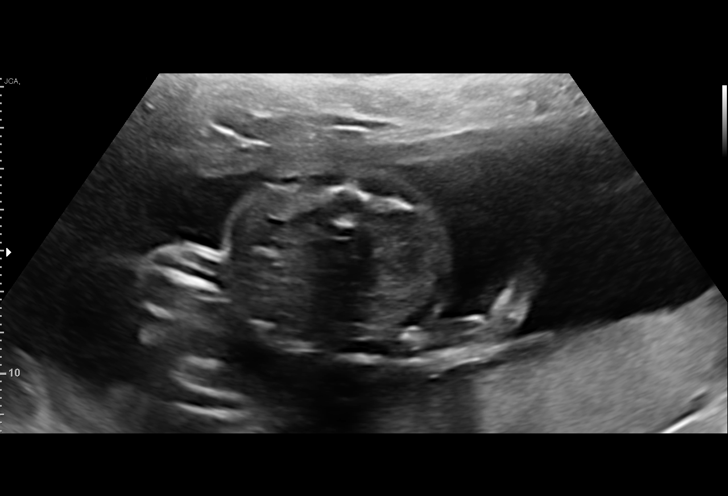
[im 25/36]
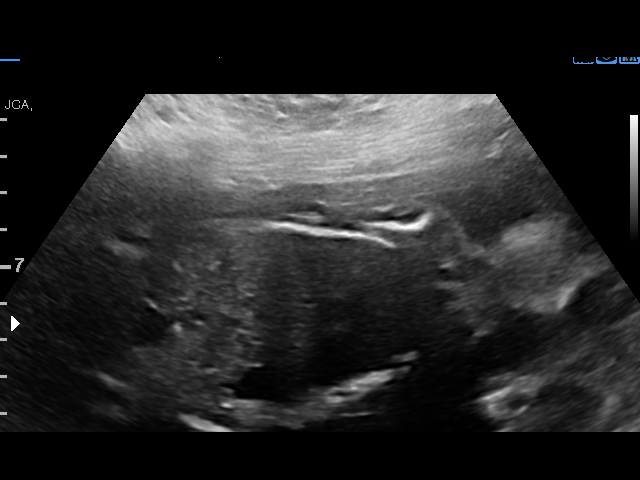
[im 28/36]
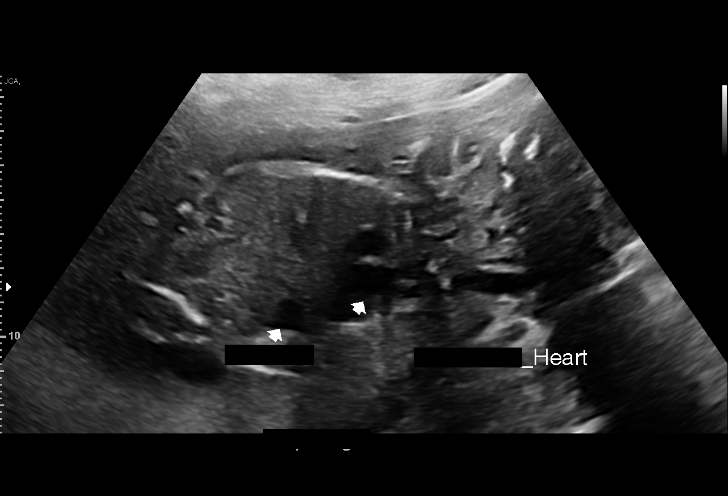
[im 30/36]
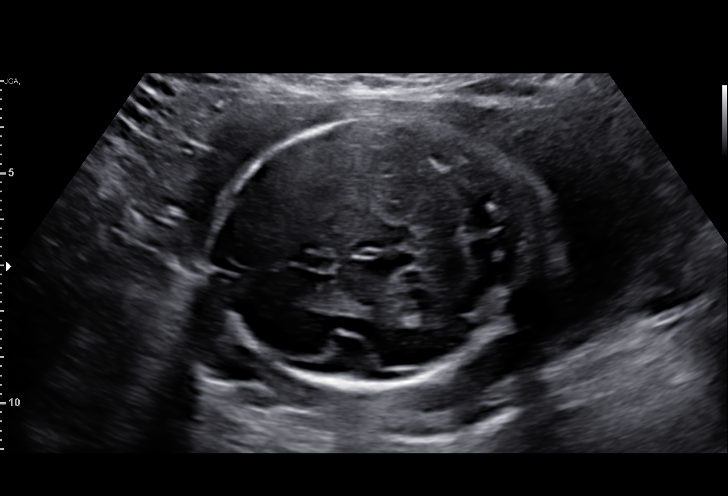
[im 33/36]
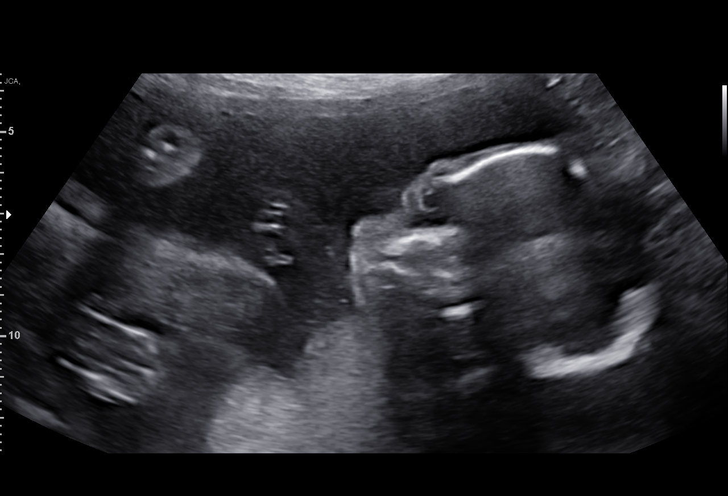
[im 36/36]
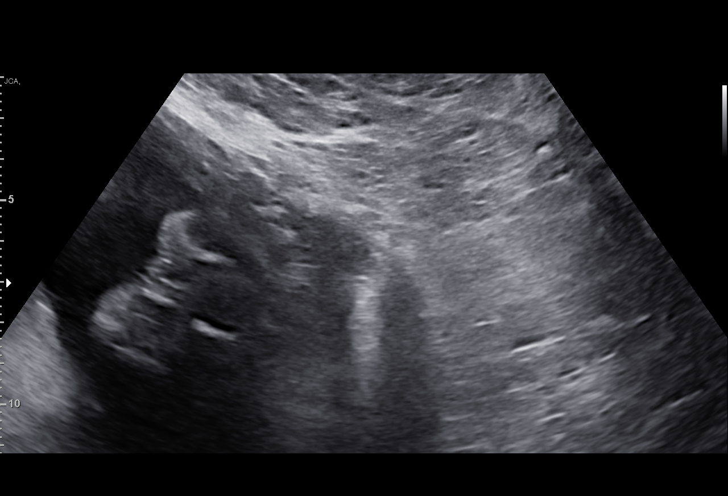

[14 of 28 positions shown; findings below may reference images not displayed]

AYA

                                                            MONTAN

Indications

 Obesity complicating pregnancy, second
 trimester
 Medical complication of pregnancy
 (Chlamydia, HUSAM HERNANADEZ)
 Encounter for antenatal screening for
 malformations
 23 weeks gestation of pregnancy
Fetal Evaluation

 Num Of Fetuses:         1
 Fetal Heart Rate(bpm):  147
 Cardiac Activity:       Observed
 Presentation:           Cephalic
 Placenta:               Posterior
 P. Cord Insertion:      Visualized

 Amniotic Fluid
 AFI FV:      Within normal limits

                             Largest Pocket(cm)

Biometry

 BPD:      57.2  mm     G. Age:  23w 4d         53  %    CI:        71.11   %    70 - 86
                                                         FL/HC:      17.6   %    19.2 -
 HC:      216.1  mm     G. Age:  23w 5d         49  %    HC/AC:      1.14        1.05 -
 AC:      189.8  mm     G. Age:  23w 5d         56  %    FL/BPD:     66.6   %    71 - 87
 FL:       38.1  mm     G. Age:  22w 1d         10  %    FL/AC:      20.1   %    20 - 24
 HUM:      38.9  mm     G. Age:  23w 6d         54  %
 LV:        3.2  mm
 Est. FW:     566  gm      1 lb 4 oz     35  %
OB History

 Gravidity:    2
 Living:       1
Gestational Age

 LMP:           24w 1d        Date:  07/22/20                 EDD:   04/28/21
 Clinical EDD:  23w 2d                                        EDD:   05/04/21
 U/S Today:     23w 2d                                        EDD:   05/04/21
 Best:          23w 2d     Det. By:  Clinical EDD             EDD:   05/04/21
Anatomy

 Cranium:               Previously seen        Aortic Arch:            Not well visualized
 Cavum:                 Previously seen        Ductal Arch:            Previously seen
 Ventricles:            Appears normal         Diaphragm:              Appears normal
 Choroid Plexus:        Previously seen        Stomach:                Appears normal, left
                                                                       sided
 Cerebellum:            Previously seen        Abdomen:                Previously seen
 Posterior Fossa:       Previously seen        Abdominal Wall:         Previously seen
 Nuchal Fold:           Not applicable (>20    Cord Vessels:           Previously seen
                        wks GA)
 Face:                  Orbits and profile     Kidneys:                Appear normal
                        previously seen
 Lips:                  Not well visualized    Bladder:                Appears normal
 Thoracic:              Appears normal         Spine:                  Limited views
                                                                       previously seen
 Heart:                 Not well visualized    Upper Extremities:      Visualized
 RVOT:                  Previously seen        Lower Extremities:      Visualized
 LVOT:                  Previously seen

 Other:  Technically difficult due to maternal habitus and fetal position. Fetus
         appears to be female.
Comments

 This patient was seen for a follow up growth scan due to
 maternal obesity.  The views of the fetal anatomy could not
 be fully visualized during her prior ultrasound exam.  She
 denies any problems since her last exam.
 She was informed that the fetal growth and amniotic fluid
 level appears appropriate for her gestational age.
 Due to maternal body habitus and the fetal position, the views
 of the fetal anatomy still could not be fully visualized today.
 A follow up exam was scheduled in 4 weeks to try to
 complete the views of the fetal anatomy.

## 2023-04-10 IMAGING — US US MFM OB FOLLOW-UP
1 series · 14 of 28 positions shown · non-contrast
Comparison: none

[Series 1: us mfm ob follow-up · 84 acquisitions, 14 frames shown]
[im 4/84]
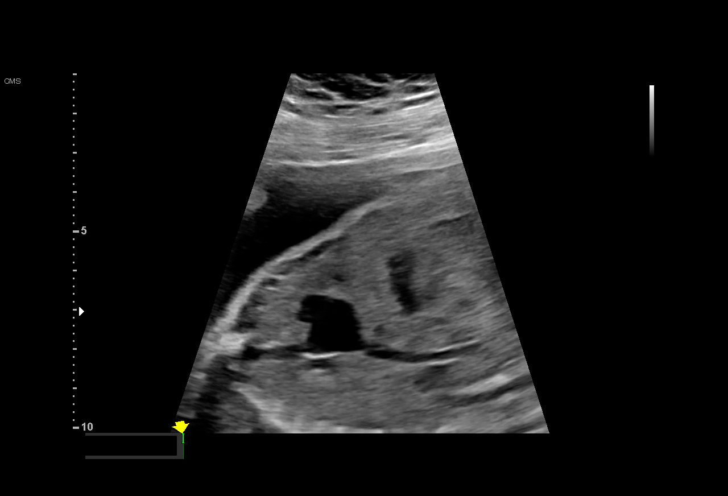
[im 10/84]
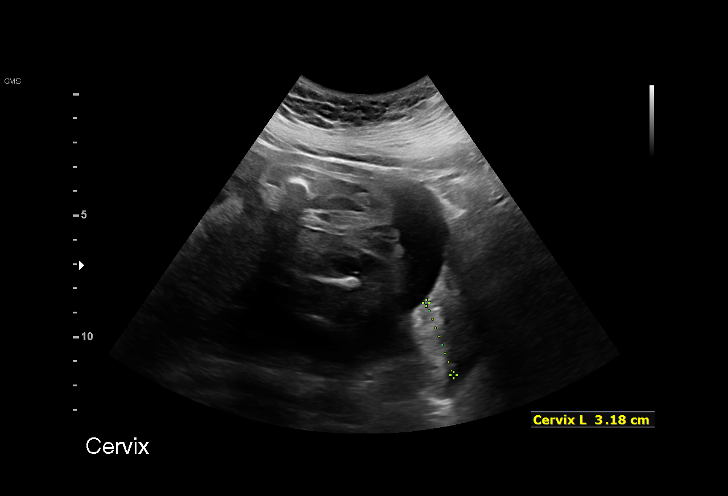
[im 16/84]
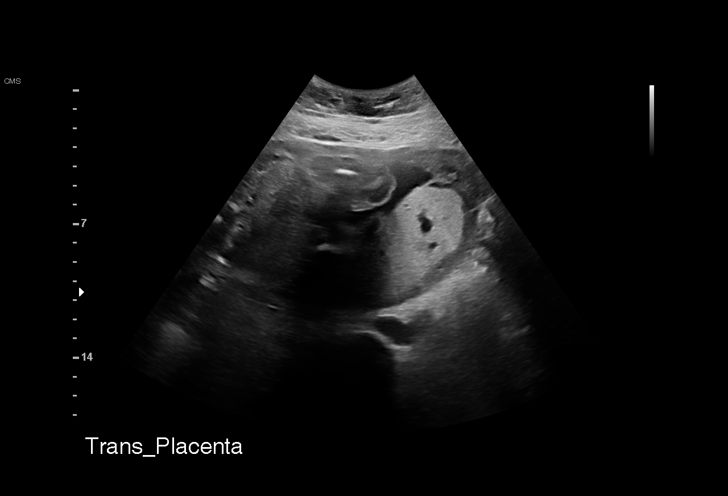
[im 22/84]
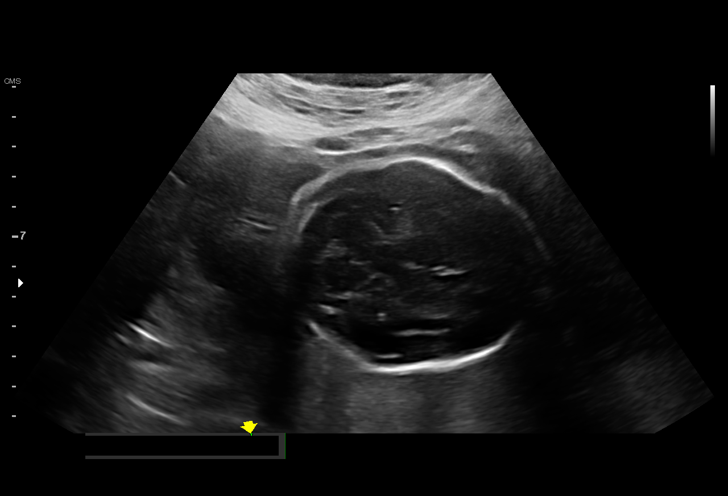
[im 28/84]
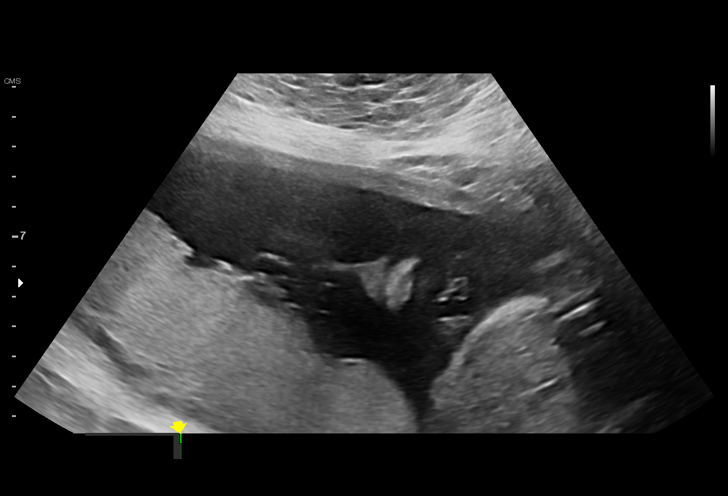
[im 34/84]
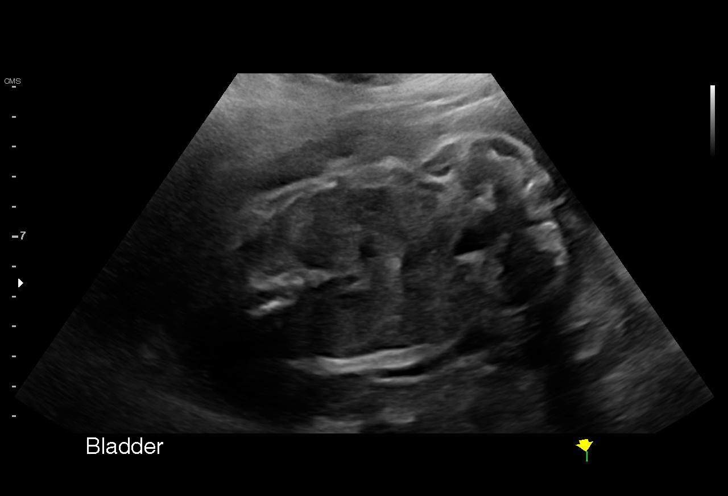
[im 40/84]
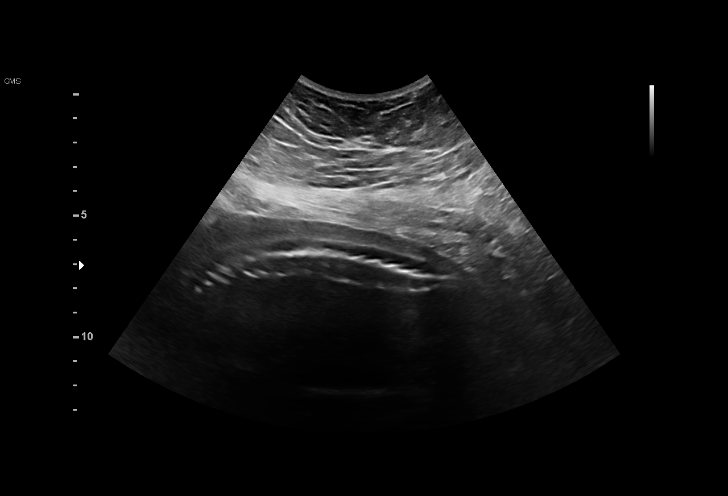
[im 47/84]
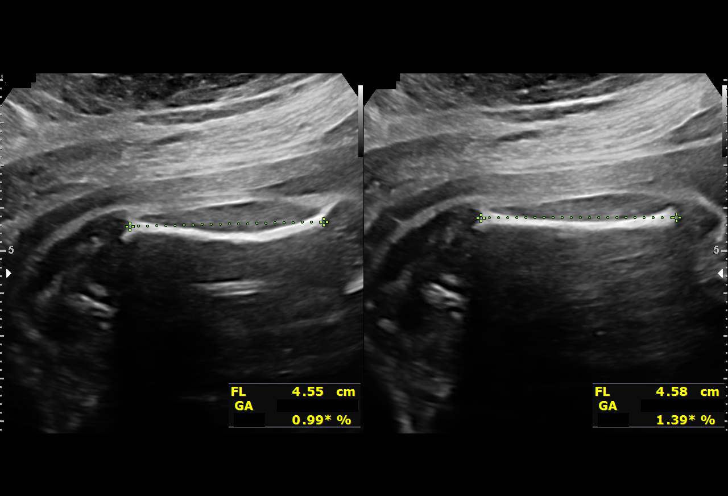
[im 53/84]
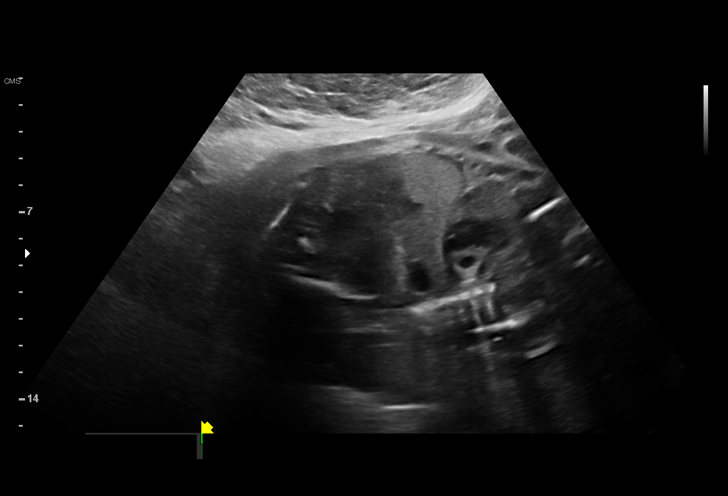
[im 59/84]
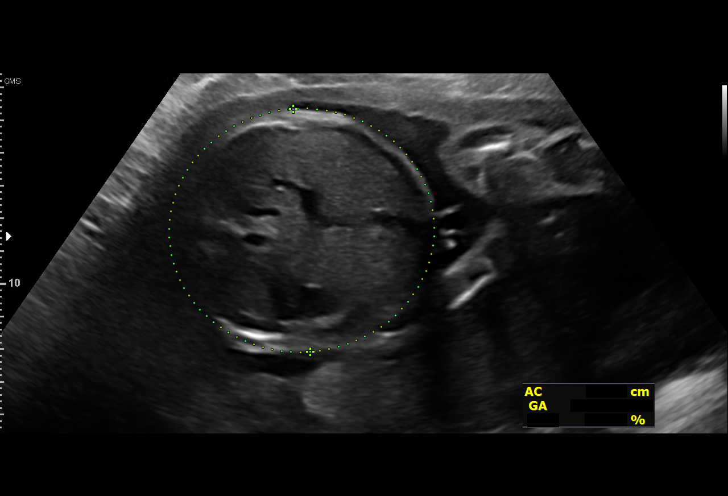
[im 65/84]
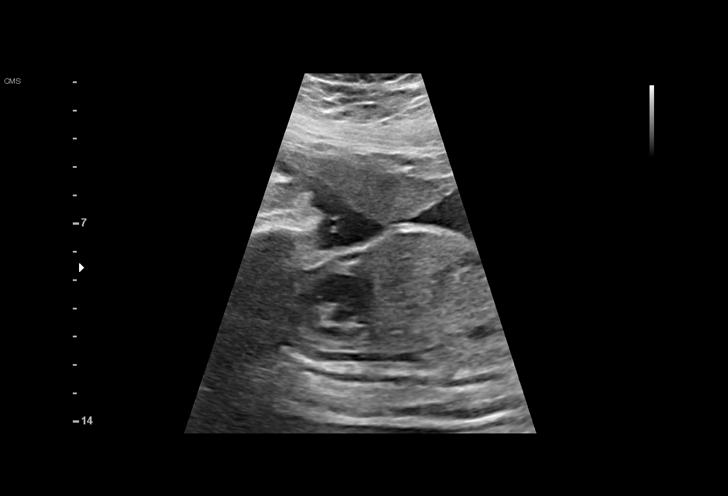
[im 71/84]
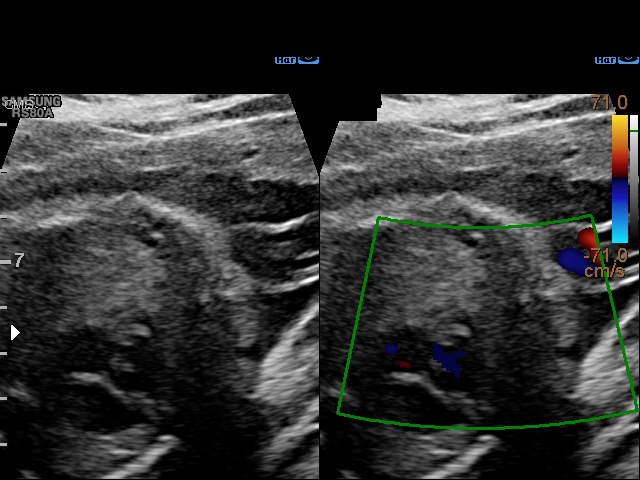
[im 77/84]
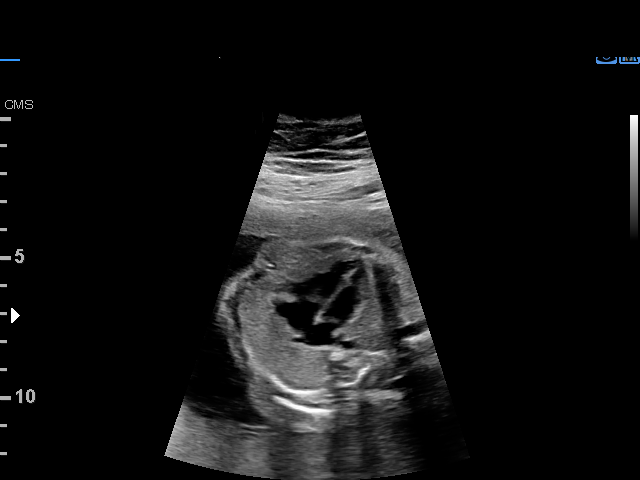
[im 84/84]
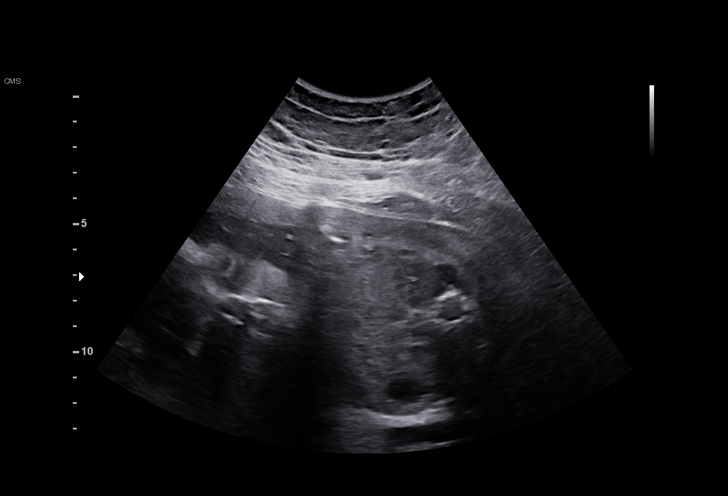

[14 of 28 positions shown; findings below may reference images not displayed]

PAULUS N

                                                            LUCATERO

Indications

 Obesity complicating pregnancy, second
 trimester
 27 weeks gestation of pregnancy
 Medical complication of pregnancy
 (Chlamydia, NAFRUE AITCHESON)
 Negative QUAD
Fetal Evaluation

 Num Of Fetuses:         1
 Fetal Heart Rate(bpm):  148
 Cardiac Activity:       Observed
 Presentation:           Variable
 Placenta:               Posterior
 P. Cord Insertion:      Previously Visualized

 Amniotic Fluid
 AFI FV:      Within normal limits

                             Largest Pocket(cm)

Biometry

 BPD:      69.5  mm     G. Age:  28w 0d         56  %    CI:        73.39   %    70 - 86
                                                         FL/HC:      17.7   %    18.6 -
 HC:      257.8  mm     G. Age:  28w 0d         39  %    HC/AC:      1.06        1.05 -
 AC:      242.7  mm     G. Age:  28w 4d         75  %    FL/BPD:     65.8   %    71 - 87
 FL:       45.7  mm     G. Age:  25w 1d        1.2  %    FL/AC:      18.8   %    20 - 24
 HUM:      45.9  mm     G. Age:  27w 0d         39  %
 LV:        3.2  mm
 Est. FW:    6721  gm      2 lb 5 oz     33  %
OB History

 Gravidity:    2
 Living:       1
Gestational Age

 LMP:           28w 2d        Date:  07/22/20                 EDD:   04/28/21
 U/S Today:     27w 3d                                        EDD:   05/04/21
 Best:          27w 3d     Det. By:  Early Ultrasound         EDD:   05/04/21
Anatomy

 Cranium:               Appears normal         Diaphragm:              Previously seen
 Cavum:                 Previously seen        Stomach:                Appears normal, left
                                                                       sided
 Ventricles:            Appears normal         Abdomen:                Previously seen
 Choroid Plexus:        Previously seen        Abdominal Wall:         Appears nml (cord
                                                                       insert, abd wall)
 Cerebellum:            Previously seen        Cord Vessels:           Appears normal (3
                                                                       vessel cord)
 Posterior Fossa:       Previously seen        Kidneys:                Appear normal
 Nuchal Fold:           Not applicable (>20    Bladder:                Appears normal
                        wks GA)
 Face:                  Appears normal         Spine:                  Appears normal
                        (orbits and profile)
 Lips:                  Appears normal         Upper Extremities:      Previously seen
 Palate:                Not well visualized    Lower Extremities:      Previously seen
 Thoracic:              Appears normal

 Other:  Technically difficult due to maternal habitus and fetal position. Fetus
         appears to be female.
Cervix Uterus Adnexa

 Cervix
 Length:           3.14  cm.
 Normal appearance by transabdominal scan.

 Uterus
 No abnormality visualized.

 Cul De Sac
 No free fluid seen.

 Adnexa
 No abnormality visualized.
Comments

 This patient was seen for a follow up growth scan due to
 maternal obesity.  She denies any problems since her last
 exam.
 She was informed that the fetal growth and amniotic fluid
 level appears appropriate for her gestational age.
 The fetal cardiac views remain unable to be fully visualized
 due to the fetal position and maternal body habitus.  Due to
 this indication, she was given a referral to [facility]
 [facility] for a fetal echocardiogram.
 A follow-up exam was scheduled in 4 weeks.

## 2024-03-03 ENCOUNTER — Ambulatory Visit: Admitting: Nurse Practitioner
